# Patient Record
Sex: Female | Born: 1980 | Race: Black or African American | Hispanic: No | Marital: Single | Smoking: Never smoker
Health system: Southern US, Community
[De-identification: ages and names within clinical notes are randomized; demographics above are authoritative.]

## PROBLEM LIST (undated history)

## (undated) ENCOUNTER — Inpatient Hospital Stay: Payer: Self-pay

## (undated) DIAGNOSIS — T300 Burn of unspecified body region, unspecified degree: Secondary | ICD-10-CM

## (undated) DIAGNOSIS — E669 Obesity, unspecified: Secondary | ICD-10-CM

## (undated) DIAGNOSIS — Z803 Family history of malignant neoplasm of breast: Secondary | ICD-10-CM

## (undated) DIAGNOSIS — B977 Papillomavirus as the cause of diseases classified elsewhere: Secondary | ICD-10-CM

## (undated) HISTORY — PX: SKIN GRAFT FULL THICKNESS ARM: SUR1297

## (undated) HISTORY — DX: Papillomavirus as the cause of diseases classified elsewhere: B97.7

## (undated) HISTORY — DX: Family history of malignant neoplasm of breast: Z80.3

---

## 2011-04-19 HISTORY — PX: DILATION AND CURETTAGE OF UTERUS: SHX78

## 2011-05-07 ENCOUNTER — Emergency Department: Payer: Self-pay | Admitting: Emergency Medicine

## 2011-05-07 LAB — URINALYSIS, COMPLETE
Bacteria: NONE SEEN
Bilirubin,UR: NEGATIVE
Glucose,UR: NEGATIVE mg/dL (ref 0–75)
Ketone: NEGATIVE
Nitrite: NEGATIVE
Ph: 6 (ref 4.5–8.0)
RBC,UR: 1 /HPF (ref 0–5)
Squamous Epithelial: 6
WBC UR: 6 /HPF (ref 0–5)

## 2011-05-07 LAB — CBC
HCT: 37.7 % (ref 35.0–47.0)
HGB: 12.8 g/dL (ref 12.0–16.0)
RBC: 3.99 10*6/uL (ref 3.80–5.20)
RDW: 12.8 % (ref 11.5–14.5)
WBC: 6.7 10*3/uL (ref 3.6–11.0)

## 2011-05-07 LAB — COMPREHENSIVE METABOLIC PANEL
Anion Gap: 12 (ref 7–16)
BUN: 7 mg/dL (ref 7–18)
Calcium, Total: 8.7 mg/dL (ref 8.5–10.1)
Chloride: 104 mmol/L (ref 98–107)
Creatinine: 0.54 mg/dL — ABNORMAL LOW (ref 0.60–1.30)
EGFR (African American): >60
EGFR (Non-African Amer.): >60
Glucose: 98 mg/dL (ref 65–99)
SGOT(AST): 17 U/L (ref 15–37)
SGPT (ALT): 19 U/L
Total Protein: 7.2 g/dL (ref 6.4–8.2)

## 2011-05-09 ENCOUNTER — Ambulatory Visit: Payer: Self-pay

## 2011-05-09 LAB — HEMATOCRIT: HCT: 38.6 % (ref 35.0–47.0)

## 2011-05-10 ENCOUNTER — Ambulatory Visit: Payer: Self-pay

## 2011-07-19 ENCOUNTER — Emergency Department: Payer: Self-pay | Admitting: *Deleted

## 2011-08-11 ENCOUNTER — Ambulatory Visit: Payer: Self-pay | Admitting: Specialist

## 2011-08-11 LAB — PREGNANCY, URINE: Pregnancy Test, Urine: NEGATIVE m[IU]/mL

## 2012-09-21 DIAGNOSIS — B977 Papillomavirus as the cause of diseases classified elsewhere: Secondary | ICD-10-CM

## 2012-09-21 HISTORY — DX: Papillomavirus as the cause of diseases classified elsewhere: B97.7

## 2014-08-10 NOTE — Op Note (Signed)
PATIENT NAME:  [name], [name] MR#:  [id number] DATE OF BIRTH:  1980/05/26  DATE OF PROCEDURE:  05/10/2011  PREOPERATIVE DIAGNOSIS: Missed abortion.   POSTOPERATIVE DIAGNOSIS: Missed abortion.   OPERATION PERFORMED: Suction curettage.   SURGEON: Wonda Cheng. Laurey Morale, M.D.   OPERATIVE FINDINGS: Expected amount of tissue.   DESCRIPTION OF PROCEDURE: After adequate general anesthesia, the patient was prepped and draped in routine fashion. The cervix was grasped with a Jacob's tenaculum. It was already dilated. The uterine cavity was systematically suction curetted with a #10 suction curette with return of expected amount of tissue. The patient tolerated the procedure well and left the operating room in good condition. Sponge and needle counts were said to be correct at the end of the procedure.   ____________________________ Wonda Cheng. Laurey Morale, MD pjr:bjt D: 05/10/2011 14:22:52 ET T: 05/10/2011 14:34:46 ET JOB#: [id number]  cc: Wonda Cheng. Laurey Morale, MD, <Dictator> Rosina Lowenstein MD ELECTRONICALLY SIGNED 05/11/2011 18:09

## 2014-08-10 NOTE — Op Note (Signed)
PATIENT NAME:  [name], [name] MR#:  [id number] DATE OF BIRTH:  1980/12/30  DATE OF PROCEDURE:  08/11/2011  PREOPERATIVE DIAGNOSIS: Volar ganglion, left wrist.   POSTOPERATIVE DIAGNOSIS: Volar ganglion, left wrist.  PROCEDURE: Excision of volar radial ganglion, left wrist.   SURGEON: Lucas Mallow, MD   ANESTHESIA: General.   COMPLICATIONS: None.   TOURNIQUET TIME: Approximately 30 minutes.   DESCRIPTION OF PROCEDURE: After adequate induction of general anesthesia, the left upper extremity is thoroughly prepped with alcohol and DuraPrep and draped in standard sterile fashion. Extremity is wrapped out with the Esmarch bandage and pneumatic tourniquet elevated to 250 mmHg. Under loupe magnification, standard longitudinal incision is made over the prominence of the volar radial ganglion. The dissection is carried down to the ganglion sac. This is completely dissected out and is seen to be coming from between the brachial fascia. The brachial fascia is incised and the dissection carried down to the joint volarly itself. The vessels are preserved. The remainder of the root of the ganglion is seen to extend to the volar radial wrist capsule and this is completely excised along with a 1 cm in diameter area of wrist capsule. Careful search for any residual areas of ganglion are made and none are seen. The wound is thoroughly irrigated multiple times. Skin edges are infiltrated with 0.5% plain Marcaine. Skin is closed with one subcuticular 5-0 Vicryl and a running subcuticular 3-0 Prolene suture. Soft bulky dressing with a volar fiberglass splint is applied. Tourniquet is released. The patient is returned to the recovery room in satisfactory condition having tolerated the procedure quite well.   ____________________________ Lucas Mallow, MD ces:drc D: 08/11/2011 09:30:37 ET T: 08/11/2011 12:15:40 ET JOB#: [id number] cc: Lucas Mallow, MD, <Dictator> Lucas Mallow  MD ELECTRONICALLY SIGNED 08/25/2011 11:45

## 2016-04-18 NOTE — L&D Delivery Note (Signed)
Obstetrical Delivery Note   Date of Delivery:   02/26/2017 Primary OB:   [facility] Gestational Age/EDD: [redacted]w[redacted]d (Dated by 8wk ultrasound) Antepartum complications: AMA and history of preterm labor and delivery x 2  Delivered By:   Dalia Heading, CNM  Delivery Type:   spontaneous vaginal delivery  Procedure Details:   Mother pushed to deliver a 7#7oz female infant in ROA with body cord x1 and true knot x 1. Baby placed on mother's abdomen and dried/ given tactile stimulation which resulted in a good cry. After delayed cord clamping the cord was clamped x 2 and cut by the FOB. Baby then weighed and returned to mother for skin to skin time. Placenta and 3 vessel cord delivered spontaneously and intact. Uterine atony followed. The bladder was emptied of about 200 ml urine, clots were evacuated from the LUS and fundal massage resolved the atony.  First degree perineal and superfical left labia laceration repaired with 3-0 Chromic. Periurethral abrasions not repaired. Anesthesia:    Local for repair Intrapartum complications: Meconium stained amniotic fluid, Variable decelerations in second stage, body cord, true knot in cord. GBS:    negative Laceration:    First degree perineal laceration and left superficial labial lacerations Episiotomy:    none Placenta:    Via active 3rd stage. To pathology: no Estimated Blood Loss:  400 ml Baby:    Liveborn female, Apgars 8/8, weight 7#7oz [place], [place]

## 2016-07-11 ENCOUNTER — Emergency Department
Admission: EM | Admit: 2016-07-11 | Discharge: 2016-07-11 | Disposition: A | Discharge status: Home / Self Care | Payer: 59 | Attending: Emergency Medicine | Admitting: Emergency Medicine

## 2016-07-11 ENCOUNTER — Encounter: Payer: Self-pay | Admitting: Emergency Medicine

## 2016-07-11 ENCOUNTER — Emergency Department: Payer: 59

## 2016-07-11 DIAGNOSIS — Z3A01 Less than 8 weeks gestation of pregnancy: Secondary | ICD-10-CM | POA: Diagnosis not present

## 2016-07-11 DIAGNOSIS — O2341 Unspecified infection of urinary tract in pregnancy, first trimester: Secondary | ICD-10-CM | POA: Insufficient documentation

## 2016-07-11 DIAGNOSIS — R102 Pelvic and perineal pain: Secondary | ICD-10-CM | POA: Diagnosis not present

## 2016-07-11 DIAGNOSIS — O26891 Other specified pregnancy related conditions, first trimester: Secondary | ICD-10-CM

## 2016-07-11 DIAGNOSIS — R109 Unspecified abdominal pain: Secondary | ICD-10-CM

## 2016-07-11 LAB — URINALYSIS, COMPLETE (UACMP) WITH MICROSCOPIC
BACTERIA UA: NONE SEEN
BILIRUBIN URINE: NEGATIVE
Glucose, UA: NEGATIVE mg/dL
Hgb urine dipstick: NEGATIVE
Ketones, ur: 5 mg/dL — AB
NITRITE: NEGATIVE
Protein, ur: NEGATIVE mg/dL
RBC / HPF: NONE SEEN RBC/hpf (ref 0–5)
Specific Gravity, Urine: 1.03 (ref 1.005–1.030)
pH: 6 (ref 5.0–8.0)

## 2016-07-11 LAB — COMPREHENSIVE METABOLIC PANEL
ALT: 13 U/L — AB (ref 14–54)
AST: 16 U/L (ref 15–41)
Albumin: 4.1 g/dL (ref 3.5–5.0)
Alkaline Phosphatase: 49 U/L (ref 38–126)
Anion gap: 5 (ref 5–15)
BUN: 11 mg/dL (ref 6–20)
CO2: 26 mmol/L (ref 22–32)
CREATININE: 0.7 mg/dL (ref 0.44–1.00)
Calcium: 9.1 mg/dL (ref 8.9–10.3)
Chloride: 104 mmol/L (ref 101–111)
Glucose, Bld: 101 mg/dL — ABNORMAL HIGH (ref 65–99)
Potassium: 3.3 mmol/L — ABNORMAL LOW (ref 3.5–5.1)
SODIUM: 135 mmol/L (ref 135–145)
Total Bilirubin: 0.6 mg/dL (ref 0.3–1.2)
Total Protein: 7.3 g/dL (ref 6.5–8.1)

## 2016-07-11 LAB — CBC
HCT: 37.8 % (ref 35.0–47.0)
Hemoglobin: 13 g/dL (ref 12.0–16.0)
MCH: 32.6 pg (ref 26.0–34.0)
MCHC: 34.3 g/dL (ref 32.0–36.0)
MCV: 94.9 fL (ref 80.0–100.0)
Platelets: 290 10*3/uL (ref 150–440)
RBC: 3.98 MIL/uL (ref 3.80–5.20)
RDW: 13.5 % (ref 11.5–14.5)
WBC: 8.6 10*3/uL (ref 3.6–11.0)

## 2016-07-11 LAB — LIPASE, BLOOD: Lipase: 16 U/L (ref 11–51)

## 2016-07-11 LAB — HCG, QUANTITATIVE, PREGNANCY: hCG, Beta Chain, Quant, S: 47448 m[IU]/mL — ABNORMAL HIGH (ref <5)

## 2016-07-11 MED ORDER — PRENATAL VITAMINS 0.8 MG PO TABS: 1.0000 | ORAL_TABLET | Freq: Every day | ORAL | 0 refills | Status: DC

## 2016-07-11 MED ORDER — ACETAMINOPHEN 325 MG PO TABS
650.0000 mg | ORAL_TABLET | Freq: Once | ORAL | Status: AC
  Administered 2016-07-11: 650 mg via ORAL
  Filled 2016-07-11: qty 2

## 2016-07-11 MED ORDER — CEPHALEXIN 500 MG PO CAPS
500.0000 mg | ORAL_CAPSULE | Freq: Once | ORAL | Status: AC
  Administered 2016-07-11: 500 mg via ORAL
  Filled 2016-07-11: qty 1

## 2016-07-11 MED ORDER — CEPHALEXIN 500 MG PO CAPS: 500.0000 mg | ORAL_CAPSULE | Freq: Two times a day (BID) | ORAL | 0 refills | Status: AC

## 2016-07-11 NOTE — ED Triage Notes (Signed)
Pt has low abd pain.  Pt had positive home pregnancy test.  No dysuria.  No vag bleeding.  Pt alert.

## 2016-07-11 NOTE — ED Provider Notes (Signed)
[facility] Emergency Department Provider Note  ____________________________________________   First MD Initiated Contact with Patient 07/11/16 2034     (approximate)  I have reviewed the triage vital signs and the nursing notes.   HISTORY  Chief Complaint Abdominal Pain   HPI [name] is a 36 y.o. female who is a G5 P2 with one miscarriage and one abortion who is presenting to the emergency department today with lower abdominal pain. She says that her last period was in February and she had a positive pregnancy test at home. She has no history of ectopic pregnancies or STDs. Is denying any vaginal bleeding or discharge. Says that her pain is a 6 out of 10. No associated nausea and vomiting.   No past medical history on file.  There are no active problems to display for this patient.   No past surgical history on file.  Prior to Admission medications   Not on File    Allergies Patient has no known allergies.  No family history on file.  Social History Social History  Substance Use Topics  . Smoking status: Not on file  . Smokeless tobacco: Not on file  . Alcohol use Not on file    Review of Systems Constitutional: No fever/chills Eyes: No visual changes. ENT: No sore throat. Cardiovascular: Denies chest pain. Respiratory: Denies shortness of breath. Gastrointestinal:  No nausea, no vomiting.  No diarrhea.  No constipation. Genitourinary: Negative for dysuria. Musculoskeletal: Negative for back pain. Skin: Negative for rash. Neurological: Negative for headaches, focal weakness or numbness.  10-point ROS otherwise negative.  ____________________________________________   PHYSICAL EXAM:  VITAL SIGNS: ED Triage Vitals  Enc Vitals Group     BP 07/11/16 1716 134/72     Pulse Rate 07/11/16 1716 71     Resp 07/11/16 1716 16     Temp 07/11/16 1716 98.3 F (36.8 C)     Temp Source 07/11/16 1716 Oral     SpO2 07/11/16 1716  100 %     Weight 07/11/16 1716 205 lb (93 kg)     Height 07/11/16 1716 5\' 7"  (1.702 m)     Head Circumference --      Peak Flow --      Pain Score 07/11/16 1722 6     Pain Loc --      Pain Edu? --      Excl. in [place]? --     Constitutional: Alert and oriented. Well appearing and in no acute distress. Eyes: Conjunctivae are normal. PERRL. EOMI. Head: Atraumatic. Nose: No congestion/rhinnorhea. Mouth/Throat: Mucous membranes are moist.   Neck: No stridor.   Cardiovascular: Normal rate, regular rhythm. Grossly normal heart sounds.  Good peripheral circulation. Respiratory: Normal respiratory effort.  No retractions. Lungs CTAB. Gastrointestinal: Soft and With mild suprapubic tenderness. No distention.  No CVA tenderness. Musculoskeletal: No lower extremity tenderness nor edema.  No joint effusions. Neurologic:  Normal speech and language. No gross focal neurologic deficits are appreciated.  Skin:  Skin is warm, dry and intact. No rash noted. Psychiatric: Mood and affect are normal. Speech and behavior are normal.  ____________________________________________   LABS (all labs ordered are listed, but only abnormal results are displayed)  Labs Reviewed  COMPREHENSIVE METABOLIC PANEL - Abnormal; Notable for the following:       Result Value   Potassium 3.3 (*)    Glucose, Bld 101 (*)    ALT 13 (*)    All other components within normal  limits  URINALYSIS, COMPLETE (UACMP) WITH MICROSCOPIC - Abnormal; Notable for the following:    Color, Urine YELLOW (*)    APPearance HAZY (*)    Ketones, ur 5 (*)    Leukocytes, UA SMALL (*)    Squamous Epithelial / LPF 6-30 (*)    All other components within normal limits  HCG, QUANTITATIVE, PREGNANCY - Abnormal; Notable for the following:    hCG, Beta Chain, Quant, S 47,448 (*)    All other components within normal limits  URINE CULTURE  LIPASE, BLOOD  CBC  POC URINE PREG, ED    ____________________________________________  EKG   ____________________________________________  RADIOLOGY    US OB Transvaginal (Final result)  Result time 07/11/16 23:07:03  Final result by Massie Kluver, MD (07/11/16 23:07:03)           Narrative:   CLINICAL DATA: Abdominal pain. No vaginal bleeding. Beta HCG 47,448  EXAM: OBSTETRIC <14 WK [place] AND TRANSVAGINAL OB US  TECHNIQUE: Both transabdominal and transvaginal ultrasound examinations were performed for complete evaluation of the gestation as well as the maternal uterus, adnexal regions, and pelvic cul-de-sac. Transvaginal technique was performed to assess early pregnancy.  COMPARISON: None.  FINDINGS: Intrauterine gestational sac: Single  Yolk sac: Visualized.  Embryo: Visualized.  Cardiac Activity: Visualized.  Heart Rate: 95 bpm  CRL: 3.2 mm  6 w  0 d         [place] EDC: 09/03/2016  Subchorionic hemorrhage: None visualized.  Maternal uterus/adnexae: Normal  IMPRESSION: 1. Viable 6 week 0 day single live intrauterine gestation. 2. Low gestational heart rate at 95 beats per minute for this stage of gestation. This has been associated with poor future viability of the pregnancy. Critical Value/emergent results were called by telephone at the time of interpretation on 07/11/2016 at 11:06 pm to Dr. Larae Grooms , who verbally acknowledged these results. 3. No subchorionic hemorrhage.   Electronically Signed By: Ashley Royalty M.D. On: 07/11/2016 23:07            US OB Comp Less 14 Wks (Final result)  Result time 07/11/16 23:07:03  Final result by Massie Kluver, MD (07/11/16 23:07:03)           Narrative:   CLINICAL DATA: Abdominal pain. No vaginal bleeding. Beta HCG 47,448  EXAM: OBSTETRIC <14 WK [place] AND TRANSVAGINAL OB US  TECHNIQUE: Both transabdominal and transvaginal ultrasound examinations were performed for complete evaluation of the gestation as  well as the maternal uterus, adnexal regions, and pelvic cul-de-sac. Transvaginal technique was performed to assess early pregnancy.  COMPARISON: None.  FINDINGS: Intrauterine gestational sac: Single  Yolk sac: Visualized.  Embryo: Visualized.  Cardiac Activity: Visualized.  Heart Rate: 95 bpm  CRL: 3.2 mm  6 w  0 d         [place] EDC: 09/03/2016  Subchorionic hemorrhage: None visualized.  Maternal uterus/adnexae: Normal  IMPRESSION: 1. Viable 6 week 0 day single live intrauterine gestation. 2. Low gestational heart rate at 95 beats per minute for this stage of gestation. This has been associated with poor future viability of the pregnancy. Critical Value/emergent results were called by telephone at the time of interpretation on 07/11/2016 at 11:06 pm to Dr. Larae Grooms , who verbally acknowledged these results. 3. No subchorionic hemorrhage.   Electronically Signed By: Ashley Royalty M.D. On: 07/11/2016 23:07            ____________________________________________   PROCEDURES  Procedure(s) performed:   Procedures  Critical Care performed:  ____________________________________________   INITIAL IMPRESSION / ASSESSMENT AND PLAN / ED COURSE  Pertinent labs & imaging results that were available during my care of the patient were reviewed by me and considered in my medical decision making (see chart for details).  ----------------------------------------- 11:16 PM on 07/11/2016 -----------------------------------------  Patient resting comfortably at this time. We discussed the RESULTS AND THE NEED FOR PELVIC REST AS WELL AS START HER PRENATAL VITAMINS. WE ALSO DISCUSSED what could be a mild UTI vs contamination. We'll be starting antibiotics for this as a precaution.  Pt will be starting pre natal vitamins and has an appointment at [facility] ob[facility]gyn on the 11th of April.  Pt denies smoking, drinking or drug use.  She is understanding  of the plan and willing to comply.        ____________________________________________   FINAL CLINICAL IMPRESSION(S) / ED DIAGNOSES  Abdominal pain in pregnancy UTI Low fetal heart rate    NEW MEDICATIONS STARTED DURING THIS VISIT:  New Prescriptions   No medications on file     Note:  This document was prepared using Dragon voice recognition software and may include unintentional dictation errors.    Orbie Pyo, MD 07/11/16 [phone]

## 2016-07-11 NOTE — ED Notes (Signed)
POC preg done and result positive.

## 2016-07-13 LAB — URINE CULTURE

## 2016-07-27 ENCOUNTER — Encounter: Payer: Self-pay | Admitting: Advanced Practice Midwife

## 2016-07-27 ENCOUNTER — Other Ambulatory Visit: Payer: 59

## 2016-07-27 ENCOUNTER — Ambulatory Visit (INDEPENDENT_AMBULATORY_CARE_PROVIDER_SITE_OTHER): Payer: 59 | Admitting: Advanced Practice Midwife

## 2016-07-27 VITALS — BP 110/70 | HR 68 | Wt 211.0 lb

## 2016-07-27 DIAGNOSIS — Z131 Encounter for screening for diabetes mellitus: Secondary | ICD-10-CM

## 2016-07-27 DIAGNOSIS — O0990 Supervision of high risk pregnancy, unspecified, unspecified trimester: Secondary | ICD-10-CM

## 2016-07-27 DIAGNOSIS — O09529 Supervision of elderly multigravida, unspecified trimester: Secondary | ICD-10-CM

## 2016-07-27 DIAGNOSIS — O3680X0 Pregnancy with inconclusive fetal viability, not applicable or unspecified: Secondary | ICD-10-CM | POA: Diagnosis not present

## 2016-07-27 DIAGNOSIS — E669 Obesity, unspecified: Secondary | ICD-10-CM

## 2016-07-27 DIAGNOSIS — Z6830 Body mass index (BMI) 30.0-30.9, adult: Secondary | ICD-10-CM

## 2016-07-27 DIAGNOSIS — O09219 Supervision of pregnancy with history of pre-term labor, unspecified trimester: Secondary | ICD-10-CM

## 2016-07-27 DIAGNOSIS — Z1379 Encounter for other screening for genetic and chromosomal anomalies: Secondary | ICD-10-CM

## 2016-07-27 DIAGNOSIS — Z369 Encounter for antenatal screening, unspecified: Secondary | ICD-10-CM

## 2016-07-27 DIAGNOSIS — Z113 Encounter for screening for infections with a predominantly sexual mode of transmission: Secondary | ICD-10-CM

## 2016-07-27 DIAGNOSIS — O09899 Supervision of other high risk pregnancies, unspecified trimester: Secondary | ICD-10-CM

## 2016-07-27 LAB — POCT URINE PREGNANCY: PREG TEST UR: POSITIVE — AB

## 2016-07-27 NOTE — Progress Notes (Signed)
New Obstetric Patient H&P    Chief Complaint: "Desires prenatal care"   History of Present Illness: Patient is a 36 y.o. [id number] Not Hispanic or Latino female, LMP 06/03/2016 presents with amenorrhea and positive home pregnancy test. Based on her  LMP, her EDD is Estimated Date of Delivery: 03/10/2017. and her EGA is [redacted]w[redacted]d. Cycles are 5. days, regular, and occur approximately every : 28 days. Her last pap smear was a few months ago and was no abnormalities.    She had a urine pregnancy test which was positive 3 week(s)  ago. Her last menstrual period was normal and lasted for  5 day(s). Since her LMP she claims she has experienced breast tenderness, fatigue, nausea. She denies vaginal bleeding. Her past medical history is noncontributory. Her prior pregnancies are notable for preterm deliveries at 32 weeks and 36 weeks. She took 17P and carried the second baby to 36 weeks. She has a history of a TAB and SAB.  Since her LMP, she admits to the use of tobacco products  no She claims she has gained   16 pounds since the start of her pregnancy.  There are cats in the home in the home  no  She admits close contact with children on a regular basis  yes  She has had chicken pox in the past no She has had Tuberculosis exposures, symptoms, or previously tested positive for TB   no Current or past history of domestic violence. no  Genetic Screening/Teratology Counseling: (Includes patient, baby's father, or anyone in either family with:)   11. Patient's age >/= 59 at [facility]  yes 2. Thalassemia ([place], [place], [place], or Asian background): MCV<80  no 3. Neural tube defect (meningomyelocele, spina bifida, anencephaly)  no 4. Congenital heart defect  no  5. Down syndrome  no 6. Tay-Sachs (Jewish, [place])  no 7. Canavan's Disease  no 8. Sickle cell disease or trait (African)  no  9. Hemophilia or other blood disorders  no  10. Muscular dystrophy  no  11. Cystic fibrosis  no    12. Huntington's Chorea  no  13. Mental retardation/autism  no 14. Other inherited genetic or chromosomal disorder  no 15. Maternal metabolic disorder (DM, PKU, etc)  no 16. Patient or FOB with a child with a birth defect not listed above no  16a. Patient or FOB with a birth defect themselves no 17. Recurrent pregnancy loss, or stillbirth  no  18. Any medications since LMP other than prenatal vitamins (include vitamins, supplements, OTC meds, drugs, alcohol)  no 19. Any other genetic/environmental exposure to discuss  No  Infection History:   1. Lives with someone with TB or TB exposed  no  2. Patient or partner has history of genital herpes  no 3. Rash or viral illness since LMP  no 4. History of STI (GC, CT, HPV, syphilis, HIV)  no 5. History of recent travel :  no  Other pertinent information:  yes Pt had an episode of heavy drinking prior to her knowledge of the pregnancy   Review of Systems:10 point review of systems negative unless otherwise noted in HPI  Past Medical History:  History reviewed. No pertinent past medical history.  Past Surgical History:  History reviewed. No pertinent surgical history.  Gynecologic History: Patient's last menstrual period was 06/03/2016 (approximate).  Obstetric History: [id number]  Family History:  Family History  Problem Relation Age of Onset  . Hypertension Mother   . Hypertension  Father   . Diabetes Paternal Grandmother   . Breast cancer Maternal Grandmother 49    Social History:  Social History   Social History  . Marital status: Single    Spouse name: N/A  . Number of children: N/A  . Years of education: N/A   Occupational History  . Not on file.   Social History Main Topics  . Smoking status: Never Smoker  . Smokeless tobacco: Never Used  . Alcohol use No  . Drug use: No  . Sexual activity: Yes    Birth control/ protection: None   Other Topics Concern  . Not on file   Social History Narrative  . No  narrative on file    Allergies:  No Known Allergies  Medications: Prior to Admission medications   Medication Sig Start Date End Date Taking? Authorizing Provider  Prenatal Vit-Fe Fumarate-FA (PNV PRENATAL PLUS MULTIVITAMIN) 27-1 MG TABS Take 1 tablet by mouth daily. 07/12/16   Historical Provider, MD    Physical Exam Vitals: Blood pressure 110/70, pulse 68, weight 211 lb (95.7 kg), last menstrual period 06/03/2016.  General: NAD HEENT: normocephalic, anicteric Thyroid: no enlargement, no palpable nodules Pulmonary: No increased work of breathing, CTAB Cardiovascular: RRR, distal pulses 2+ Abdomen: NABS, soft, non-tender, non-distended.  Umbilicus without lesions.  No hepatomegaly, splenomegaly or masses palpable. No evidence of hernia  Genitourinary:  External: Normal external female genitalia.  Normal urethral meatus, normal  Bartholin's and Skene's glands.    Vagina: Normal vaginal mucosa, no evidence of prolapse.    Cervix: Grossly normal in appearance, no bleeding, No CMT  Uterus: Enlarged, mobile, normal contour.    Adnexa: ovaries non-enlarged, no adnexal masses  Rectal: deferred Extremities: no edema, erythema, or tenderness Neurologic: Grossly intact Psychiatric: mood appropriate, affect full   Assessment: 36 y.o. [id number] at [redacted]w[redacted]d by U/S today with an EDD of 03/04/2017  Plan: 1) Avoid alcoholic beverages. 2) Patient encouraged not to smoke.  3) Discontinue the use of all non-medicinal drugs and chemicals.  4) Take prenatal vitamins daily.  5) Nutrition, food safety (fish, cheese advisories, and high nitrite foods) and exercise discussed. 6) Hospital and practice style discussed with cross coverage system.  7) Genetic Screening, such as with 1st Trimester Screening, cell free fetal DNA, AFP testing, and Ultrasound, as well as with amniocentesis and CVS as appropriate, is discussed with patient. At the conclusion of today's visit patient requested genetic testing 8)  Patient is asked about travel to areas at risk for the Zika virus, and counseled to avoid travel and exposure to mosquitoes or sexual partners who may have themselves been exposed to the virus. Testing is discussed, and will be ordered as appropriate.  9) Prenatal labs, early 1 hr gtt at next visit 10) Pt requests genetic screening for significant family history of breast cancer   Rod Can, CNM

## 2016-07-29 LAB — URINE CULTURE

## 2016-07-29 LAB — GC/CHLAMYDIA PROBE AMP
Chlamydia trachomatis, NAA: NEGATIVE
NEISSERIA GONORRHOEAE BY PCR: NEGATIVE

## 2016-08-24 ENCOUNTER — Ambulatory Visit (INDEPENDENT_AMBULATORY_CARE_PROVIDER_SITE_OTHER): Payer: 59 | Admitting: Obstetrics and Gynecology

## 2016-08-24 ENCOUNTER — Ambulatory Visit (INDEPENDENT_AMBULATORY_CARE_PROVIDER_SITE_OTHER): Payer: 59

## 2016-08-24 VITALS — BP 116/78 | Wt 216.0 lb

## 2016-08-24 DIAGNOSIS — Z36 Encounter for antenatal screening for chromosomal anomalies: Secondary | ICD-10-CM

## 2016-08-24 DIAGNOSIS — O0990 Supervision of high risk pregnancy, unspecified, unspecified trimester: Secondary | ICD-10-CM

## 2016-08-24 DIAGNOSIS — R87618 Other abnormal cytological findings on specimens from cervix uteri: Secondary | ICD-10-CM

## 2016-08-24 DIAGNOSIS — R8789 Other abnormal findings in specimens from female genital organs: Secondary | ICD-10-CM

## 2016-08-24 DIAGNOSIS — Z369 Encounter for antenatal screening, unspecified: Secondary | ICD-10-CM

## 2016-08-24 DIAGNOSIS — O09521 Supervision of elderly multigravida, first trimester: Secondary | ICD-10-CM

## 2016-08-24 DIAGNOSIS — Z3A12 12 weeks gestation of pregnancy: Secondary | ICD-10-CM

## 2016-08-24 DIAGNOSIS — O09899 Supervision of other high risk pregnancies, unspecified trimester: Secondary | ICD-10-CM

## 2016-08-24 DIAGNOSIS — O09219 Supervision of pregnancy with history of pre-term labor, unspecified trimester: Secondary | ICD-10-CM

## 2016-08-24 MED ORDER — HYDROXYPROGESTERONE CAPROATE 250 MG/ML IM OIL: 250.0000 mg | TOPICAL_OIL | INTRAMUSCULAR | 4 refills | Status: DC

## 2016-08-24 NOTE — Progress Notes (Signed)
Dating scan today.  

## 2016-08-24 NOTE — Progress Notes (Signed)
    Routine Prenatal Care Visit  Subjective  Fetal Movement? no Contractions? no Leaking Fluid? no Vaginal Bleeding? Yes light spotting still  Objective   Vitals:   08/24/16 1101  BP: 116/78    @WEIGHTCHANGE @ Urine dipstick shows negative for all components.  General: NAD Pumonary: no increased work of breathing Abdomen: gravid, non-tender, fundal height fetal heart tones 160BPM GU: normal external female genitalia, normal cervix, no bleeding Extremities: no edema Psychiatric: mood appropriate, affect full   Assessment   36 y.o. [id number] at [redacted]w[redacted]d by  03/04/2017, by Ultrasound presenting for routine prenatal visit  pregnancy 5 Problems (from 06/03/16 to present)    No problems associated with this episode.       Plan   Problem List Items Addressed This Visit      Other   Pregnancy, supervision, high-risk, unspecified trimester   Relevant Orders   informaSeq(SM) with XY Analysis   History of preterm delivery, currently pregnant   Relevant Orders   US OB Transvaginal    Other Visit Diagnoses    [redacted] weeks gestation of pregnancy    -  Primary   Relevant Orders   RPR+Rh+ABO+Rub Ab+Ab Scr+CB...   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

## 2016-08-24 NOTE — Patient Instructions (Signed)

## 2016-08-26 LAB — RPR+RH+ABO+RUB AB+AB SCR+CB...
ANTIBODY SCREEN: NEGATIVE
HEMATOCRIT: 36.5 % (ref 34.0–46.6)
HEMOGLOBIN: 12.5 g/dL (ref 11.1–15.9)
HIV Screen 4th Generation wRfx: NONREACTIVE
Hepatitis B Surface Ag: NEGATIVE
MCH: 32.1 pg (ref 26.6–33.0)
MCHC: 34.2 g/dL (ref 31.5–35.7)
MCV: 94 fL (ref 79–97)
Platelets: 315 10*3/uL (ref 150–379)
RBC: 3.89 x10E6/uL (ref 3.77–5.28)
RDW: 13.6 % (ref 12.3–15.4)
RPR Ser Ql: NONREACTIVE
Rh Factor: POSITIVE
Rubella Antibodies, IGG: 1.31 index (ref >0.99)
Varicella zoster IgG: 1000 index (ref >165)
WBC: 7.6 10*3/uL (ref 3.4–10.8)

## 2016-08-26 LAB — URINE CULTURE

## 2016-08-26 LAB — HEMOGLOBINOPATHY EVALUATION
HEMOGLOBIN A2 QUANTITATION: 2.7 % (ref 1.8–3.2)
HEMOGLOBIN F QUANTITATION: 0 % (ref 0.0–2.0)
HGB C: 0 %
HGB S: 0 %
HGB VARIANT: 0 %
Hgb A: 97.3 % (ref 96.4–98.8)

## 2016-08-27 LAB — PAPIG, CTNGTV, HPV, RFX 16/18
CHLAMYDIA, NUC. ACID AMP: NEGATIVE
Gonococcus, Nuc. Acid Amp: NEGATIVE
PAP SMEAR COMMENT: 0
TRICH VAG BY NAA: NEGATIVE

## 2016-08-27 LAB — HPV, LOW VOLUME (REFLEX): HPV low volume reflex: NEGATIVE

## 2016-08-30 ENCOUNTER — Telehealth: Payer: Self-pay

## 2016-08-30 NOTE — Telephone Encounter (Signed)
FMLA/DISABILITY form filled out for [facility] [facility] [employer] and given to [place] for processing.

## 2016-09-01 ENCOUNTER — Encounter: Payer: Self-pay | Admitting: Obstetrics and Gynecology

## 2016-09-01 NOTE — Progress Notes (Signed)
Can we see why the informaseq didn't result

## 2016-09-02 ENCOUNTER — Encounter: Payer: Self-pay | Admitting: Obstetrics and Gynecology

## 2016-09-02 LAB — INFORMASEQ(SM) WITH XY ANALYSIS
FETAL FRACTION (%): 7.9
Fetal Number: 1
Gestational Age at Collection: 12.6 weeks
Weight: 216 [lb_av]

## 2016-09-02 NOTE — Progress Notes (Signed)
I called and spoke to [place] [facility] and gave her the missing information. She states report should be resulted in next few days. KJ CMA

## 2016-09-02 NOTE — Progress Notes (Signed)
Krystal, [facility], checking on this.

## 2016-09-02 NOTE — Progress Notes (Signed)
Per Albina Billet, nurse has to call number provided on results and give additional information. I will do this.

## 2016-09-09 ENCOUNTER — Other Ambulatory Visit: Payer: Self-pay | Admitting: Advanced Practice Midwife

## 2016-09-09 DIAGNOSIS — O09219 Supervision of pregnancy with history of pre-term labor, unspecified trimester: Principal | ICD-10-CM

## 2016-09-09 DIAGNOSIS — O09899 Supervision of other high risk pregnancies, unspecified trimester: Secondary | ICD-10-CM

## 2016-09-09 MED ORDER — HYDROXYPROGESTERONE CAPROATE 275 MG/1.1ML ~~LOC~~ SOAJ: 1.0000 | SUBCUTANEOUS | 0 refills | Status: DC

## 2016-09-15 ENCOUNTER — Telehealth: Payer: Self-pay | Admitting: Obstetrics & Gynecology

## 2016-09-15 NOTE — Telephone Encounter (Signed)
Pt is calling about an missed call from 09/14/16. Pt says she takes lunch at 12. And gets off work 3:30. Please Call back .

## 2016-09-15 NOTE — Telephone Encounter (Signed)
Pt aware [name] is here.  She was given the option to come in on a diff day from her appt to get it sooner or get [facility] when she comes for her appt.  Pt opts to get inj at appt so she doesn't have to make two trips.

## 2016-09-21 ENCOUNTER — Encounter: Payer: 59 | Admitting: Obstetrics & Gynecology

## 2016-09-21 ENCOUNTER — Other Ambulatory Visit: Payer: 59

## 2016-09-21 ENCOUNTER — Ambulatory Visit (INDEPENDENT_AMBULATORY_CARE_PROVIDER_SITE_OTHER): Payer: 59

## 2016-09-21 ENCOUNTER — Ambulatory Visit (INDEPENDENT_AMBULATORY_CARE_PROVIDER_SITE_OTHER): Payer: 59 | Admitting: Obstetrics & Gynecology

## 2016-09-21 ENCOUNTER — Encounter: Payer: Self-pay | Admitting: Obstetrics & Gynecology

## 2016-09-21 VITALS — BP 110/60 | Wt 218.0 lb

## 2016-09-21 DIAGNOSIS — O09899 Supervision of other high risk pregnancies, unspecified trimester: Secondary | ICD-10-CM

## 2016-09-21 DIAGNOSIS — O09219 Supervision of pregnancy with history of pre-term labor, unspecified trimester: Secondary | ICD-10-CM

## 2016-09-21 DIAGNOSIS — O0990 Supervision of high risk pregnancy, unspecified, unspecified trimester: Secondary | ICD-10-CM

## 2016-09-21 DIAGNOSIS — Z3A16 16 weeks gestation of pregnancy: Secondary | ICD-10-CM

## 2016-09-21 DIAGNOSIS — O09529 Supervision of elderly multigravida, unspecified trimester: Secondary | ICD-10-CM

## 2016-09-21 MED ORDER — HYDROXYPROGESTERONE CAPROATE 275 MG/1.1ML ~~LOC~~ SOAJ
275.0000 mg | Freq: Once | SUBCUTANEOUS | Status: AC
  Administered 2016-09-21: 275 mg via SUBCUTANEOUS

## 2016-09-21 MED ORDER — FERRALET 90 90-1 MG PO TABS: 1.0000 | ORAL_TABLET | Freq: Every day | ORAL | 2 refills | Status: DC

## 2016-09-21 MED ORDER — DOXYLAMINE-PYRIDOXINE 10-10 MG PO TBEC: 2.0000 | DELAYED_RELEASE_TABLET | Freq: Every day | ORAL | 5 refills | Status: DC

## 2016-09-21 MED ORDER — FERROUS GLUCONATE 324 (38 FE) MG PO TABS: 324.0000 mg | ORAL_TABLET | Freq: Every day | ORAL | 3 refills | Status: DC

## 2016-09-21 MED ORDER — PROMETHAZINE HCL 25 MG PO TABS: 25.0000 mg | ORAL_TABLET | Freq: Four times a day (QID) | ORAL | 2 refills | Status: DC | PRN

## 2016-09-21 NOTE — Progress Notes (Signed)
[place] cervix >3 cm today.  Discussed. cfDNA normal XY Diclegis for nausea, Fe for weakness fatigue Desires/Plan for 17OH-P, first shot today.

## 2016-09-21 NOTE — Addendum Note (Signed)
Addended by: Quintella Baton D on: 09/21/2016 10:24 AM   Modules accepted: Orders

## 2016-09-21 NOTE — Addendum Note (Signed)
Addended by: Gae Dry on: 09/21/2016 10:15 AM   Modules accepted: Orders

## 2016-09-26 ENCOUNTER — Telehealth: Payer: Self-pay

## 2016-09-26 NOTE — Telephone Encounter (Signed)
Pt is having an issue c Makena inj.  [facility] in [place] is telling her she has to go to [facility] to p/u or have it mailed.  It may not be here by Wed if mailed.  The new subq Makena made her arm hurt.  She didn't like it and wanted the old kind which was ordered.  Adv to call [facility] in [place] and have them to mail it to [place].

## 2016-09-28 ENCOUNTER — Other Ambulatory Visit: Payer: Self-pay

## 2016-09-28 ENCOUNTER — Ambulatory Visit (INDEPENDENT_AMBULATORY_CARE_PROVIDER_SITE_OTHER): Payer: 59

## 2016-09-28 ENCOUNTER — Telehealth: Payer: Self-pay

## 2016-09-28 MED ORDER — HYDROXYPROGESTERONE CAPROATE 250 MG/ML IM OIL: TOPICAL_OIL | INTRAMUSCULAR | 4 refills | Status: DC

## 2016-09-28 MED ORDER — HYDROXYPROGESTERONE CAPROATE 250 MG/ML IM OIL
250.0000 mg | TOPICAL_OIL | Freq: Once | INTRAMUSCULAR | Status: AC
  Administered 2016-09-28: 250 mg via INTRAMUSCULAR

## 2016-09-28 NOTE — Progress Notes (Signed)
Pt here for Makena inj which was given IM right glut.  Pt requested to switch to IM inj instead of SubQ.  NDC# [phone]

## 2016-09-28 NOTE — Telephone Encounter (Signed)
Pt left msg on triage stating that she needed to cancel appt for injection today due to not sure what is going on with the medication. Per telephone note on 09/26/16 pt was to call [facility] in [place] to have them mail it to [place]. Left msg for pt to call back and get details from her before contacting the pharmacy.

## 2016-10-03 ENCOUNTER — Encounter: Payer: Self-pay | Admitting: Obstetrics and Gynecology

## 2016-10-03 NOTE — Telephone Encounter (Signed)
Pt was ordered injectable makena and shipment has arrived. Pt aware.

## 2016-10-05 ENCOUNTER — Other Ambulatory Visit: Payer: 59

## 2016-10-05 ENCOUNTER — Encounter: Payer: 59 | Admitting: Obstetrics and Gynecology

## 2016-10-13 ENCOUNTER — Encounter: Payer: Self-pay | Admitting: Obstetrics and Gynecology

## 2016-10-14 ENCOUNTER — Encounter: Payer: Self-pay | Admitting: Obstetrics and Gynecology

## 2016-10-18 ENCOUNTER — Ambulatory Visit (INDEPENDENT_AMBULATORY_CARE_PROVIDER_SITE_OTHER): Payer: 59 | Admitting: Obstetrics and Gynecology

## 2016-10-18 ENCOUNTER — Telehealth: Payer: Self-pay

## 2016-10-18 VITALS — BP 114/74 | Wt 226.0 lb

## 2016-10-18 DIAGNOSIS — O09899 Supervision of other high risk pregnancies, unspecified trimester: Secondary | ICD-10-CM

## 2016-10-18 DIAGNOSIS — O4692 Antepartum hemorrhage, unspecified, second trimester: Secondary | ICD-10-CM | POA: Diagnosis not present

## 2016-10-18 DIAGNOSIS — O09529 Supervision of elderly multigravida, unspecified trimester: Secondary | ICD-10-CM

## 2016-10-18 DIAGNOSIS — O09219 Supervision of pregnancy with history of pre-term labor, unspecified trimester: Secondary | ICD-10-CM

## 2016-10-18 DIAGNOSIS — Z3A20 20 weeks gestation of pregnancy: Secondary | ICD-10-CM

## 2016-10-18 DIAGNOSIS — O0990 Supervision of high risk pregnancy, unspecified, unspecified trimester: Secondary | ICD-10-CM

## 2016-10-18 LAB — POCT WET PREP WITH KOH
Clue Cells Wet Prep HPF POC: NEGATIVE
PH, VAGINAL: 5
Trichomonas, UA: NEGATIVE
Yeast Wet Prep HPF POC: NEGATIVE

## 2016-10-18 NOTE — Progress Notes (Signed)
Work in [employer] pt, bleeding. Noted bright red spotting on tissue last night. None since. Mild cramping last night. None present.  No abnormal discharge.   SSE: NEFG, no blood in vaginal vault. Cervix appears closed (confirmed by digital exam) No lesions seen  Wet prep: PH: 5.0 Trichomonas: absent Yeast: absent Clue cells: absent  17-OHPC today. F/u 1 week. Pt reassured. Precautions given.

## 2016-10-18 NOTE — Telephone Encounter (Signed)
Pt had a little bleeding last night, some colored d/c still there.  Is supposed to go to [facility] to the beach tomorrow and wanted to be seen.  She did have IC Sun.  Adv it could be from that but we need to ck her anyway.  Appt this am at 11:10 c SDJ.

## 2016-10-20 ENCOUNTER — Other Ambulatory Visit: Payer: Self-pay | Admitting: Obstetrics and Gynecology

## 2016-10-20 ENCOUNTER — Telehealth: Payer: Self-pay

## 2016-10-20 DIAGNOSIS — Z363 Encounter for antenatal screening for malformations: Secondary | ICD-10-CM

## 2016-10-20 NOTE — Telephone Encounter (Signed)
Specialty pharmacy calling because they received a Makena order and it is missing additional information. CB# [phone]

## 2016-10-24 ENCOUNTER — Ambulatory Visit (INDEPENDENT_AMBULATORY_CARE_PROVIDER_SITE_OTHER): Payer: 59 | Admitting: Obstetrics and Gynecology

## 2016-10-24 ENCOUNTER — Ambulatory Visit (INDEPENDENT_AMBULATORY_CARE_PROVIDER_SITE_OTHER): Payer: 59

## 2016-10-24 VITALS — BP 118/70 | Wt 230.0 lb

## 2016-10-24 DIAGNOSIS — Z363 Encounter for antenatal screening for malformations: Secondary | ICD-10-CM | POA: Diagnosis not present

## 2016-10-24 DIAGNOSIS — O09219 Supervision of pregnancy with history of pre-term labor, unspecified trimester: Secondary | ICD-10-CM

## 2016-10-24 DIAGNOSIS — Z8751 Personal history of pre-term labor: Secondary | ICD-10-CM

## 2016-10-24 DIAGNOSIS — O09529 Supervision of elderly multigravida, unspecified trimester: Secondary | ICD-10-CM

## 2016-10-24 DIAGNOSIS — Z3A21 21 weeks gestation of pregnancy: Secondary | ICD-10-CM

## 2016-10-24 DIAGNOSIS — O09899 Supervision of other high risk pregnancies, unspecified trimester: Secondary | ICD-10-CM

## 2016-10-24 MED ORDER — HYDROXYPROGESTERONE CAPROATE 250 MG/ML IM OIL
250.0000 mg | TOPICAL_OIL | Freq: Once | INTRAMUSCULAR | Status: AC
Start: 2016-10-24 — End: 2016-10-24
  Administered 2016-10-24: 250 mg via INTRAMUSCULAR

## 2016-10-24 NOTE — Progress Notes (Signed)
No vb. No lof.  Anatomy screen complete.  cvx 4.7 cm

## 2016-11-01 ENCOUNTER — Ambulatory Visit (INDEPENDENT_AMBULATORY_CARE_PROVIDER_SITE_OTHER): Payer: 59

## 2016-11-01 DIAGNOSIS — O09212 Supervision of pregnancy with history of pre-term labor, second trimester: Secondary | ICD-10-CM

## 2016-11-01 MED ORDER — HYDROXYPROGESTERONE CAPROATE 250 MG/ML IM OIL
250.0000 mg | TOPICAL_OIL | Freq: Once | INTRAMUSCULAR | Status: AC
  Administered 2016-11-01: 250 mg via INTRAMUSCULAR

## 2016-11-08 ENCOUNTER — Ambulatory Visit (INDEPENDENT_AMBULATORY_CARE_PROVIDER_SITE_OTHER): Payer: 59

## 2016-11-08 DIAGNOSIS — O09212 Supervision of pregnancy with history of pre-term labor, second trimester: Secondary | ICD-10-CM | POA: Diagnosis not present

## 2016-11-08 DIAGNOSIS — Z3A23 23 weeks gestation of pregnancy: Secondary | ICD-10-CM | POA: Diagnosis not present

## 2016-11-08 MED ORDER — HYDROXYPROGESTERONE CAPROATE 250 MG/ML IM OIL
250.0000 mg | TOPICAL_OIL | Freq: Once | INTRAMUSCULAR | Status: AC
  Administered 2016-11-08: 250 mg via INTRAMUSCULAR

## 2016-11-15 ENCOUNTER — Ambulatory Visit (INDEPENDENT_AMBULATORY_CARE_PROVIDER_SITE_OTHER): Payer: 59

## 2016-11-15 DIAGNOSIS — Z8751 Personal history of pre-term labor: Secondary | ICD-10-CM | POA: Diagnosis not present

## 2016-11-15 MED ORDER — HYDROXYPROGESTERONE CAPROATE 250 MG/ML IM OIL
250.0000 mg | TOPICAL_OIL | Freq: Once | INTRAMUSCULAR | Status: AC
  Administered 2016-11-15: 250 mg via INTRAMUSCULAR

## 2016-11-21 ENCOUNTER — Encounter: Payer: Self-pay | Admitting: Obstetrics and Gynecology

## 2016-11-21 ENCOUNTER — Ambulatory Visit (INDEPENDENT_AMBULATORY_CARE_PROVIDER_SITE_OTHER): Payer: 59 | Admitting: Obstetrics and Gynecology

## 2016-11-21 ENCOUNTER — Encounter: Payer: 59 | Admitting: Obstetrics and Gynecology

## 2016-11-21 VITALS — BP 108/60 | Wt 238.0 lb

## 2016-11-21 DIAGNOSIS — O09529 Supervision of elderly multigravida, unspecified trimester: Secondary | ICD-10-CM

## 2016-11-21 DIAGNOSIS — O0990 Supervision of high risk pregnancy, unspecified, unspecified trimester: Secondary | ICD-10-CM

## 2016-11-21 DIAGNOSIS — Z3A24 24 weeks gestation of pregnancy: Secondary | ICD-10-CM | POA: Diagnosis not present

## 2016-11-21 DIAGNOSIS — O09899 Supervision of other high risk pregnancies, unspecified trimester: Secondary | ICD-10-CM

## 2016-11-21 DIAGNOSIS — O09219 Supervision of pregnancy with history of pre-term labor, unspecified trimester: Secondary | ICD-10-CM

## 2016-11-21 DIAGNOSIS — Z8751 Personal history of pre-term labor: Secondary | ICD-10-CM

## 2016-11-21 MED ORDER — HYDROXYPROGESTERONE CAPROATE 250 MG/ML IM OIL
250.0000 mg | TOPICAL_OIL | Freq: Once | INTRAMUSCULAR | Status: AC
  Administered 2016-11-21: 250 mg via INTRAMUSCULAR

## 2016-11-21 MED ORDER — HYDROXYPROGESTERONE CAPROATE 275 MG/1.1ML ~~LOC~~ SOAJ: 275.0000 mg | Freq: Once | SUBCUTANEOUS | Status: DC

## 2016-11-21 NOTE — Progress Notes (Signed)
Pain in lower pain/diarrhea Swelling in feet 17P today

## 2016-11-22 ENCOUNTER — Telehealth: Payer: Self-pay

## 2016-11-22 NOTE — Progress Notes (Signed)
Anatomy scan normal cervical length last visit, given history of preterm labor and symptoms of pressure will obtain follow up cervical length although reassured by cervical exam today

## 2016-11-22 NOTE — Telephone Encounter (Signed)
Rosilyn Mings, RN for [employer], calling. [employer] is very strict. She is asking that the work note written for this patient yesterday be altered. Pt is on mandantory OT & they will not allow her to only work 8 hours as this shows partiality to her for pregnancy which is not considered a disability. She gets breaks every two hours & they accomodate her for sitting when needed, but she will be forced to go home unless the limit shifts to 8 hours is removed. RN states you may want to add to the No prolonged standing "for more than 2 hours" to clarify. She has taking this to legal and these are the suggestions. Please fax updated note to [phone]. Needs ASAP or pt will be sent home & she doesn't have any accrued time to use for being off.

## 2016-11-22 NOTE — Telephone Encounter (Signed)
Per SDJ you can adjust letter as needed for job

## 2016-11-22 NOTE — Telephone Encounter (Signed)
Letter recreated & faxed to Rosilyn Mings @[employer] .

## 2016-11-24 ENCOUNTER — Other Ambulatory Visit: Payer: Self-pay | Admitting: Obstetrics and Gynecology

## 2016-11-24 DIAGNOSIS — O09899 Supervision of other high risk pregnancies, unspecified trimester: Secondary | ICD-10-CM

## 2016-11-24 DIAGNOSIS — Z3A24 24 weeks gestation of pregnancy: Secondary | ICD-10-CM

## 2016-11-24 DIAGNOSIS — O09219 Supervision of pregnancy with history of pre-term labor, unspecified trimester: Secondary | ICD-10-CM

## 2016-11-24 DIAGNOSIS — O0990 Supervision of high risk pregnancy, unspecified, unspecified trimester: Secondary | ICD-10-CM

## 2016-11-24 DIAGNOSIS — O09529 Supervision of elderly multigravida, unspecified trimester: Secondary | ICD-10-CM

## 2016-11-24 DIAGNOSIS — Z8751 Personal history of pre-term labor: Secondary | ICD-10-CM

## 2016-11-28 ENCOUNTER — Ambulatory Visit (INDEPENDENT_AMBULATORY_CARE_PROVIDER_SITE_OTHER): Payer: 59

## 2016-11-28 ENCOUNTER — Ambulatory Visit (INDEPENDENT_AMBULATORY_CARE_PROVIDER_SITE_OTHER): Payer: 59 | Admitting: Obstetrics & Gynecology

## 2016-11-28 VITALS — BP 120/80 | Wt 238.0 lb

## 2016-11-28 DIAGNOSIS — O0990 Supervision of high risk pregnancy, unspecified, unspecified trimester: Secondary | ICD-10-CM

## 2016-11-28 DIAGNOSIS — Z3A26 26 weeks gestation of pregnancy: Secondary | ICD-10-CM

## 2016-11-28 DIAGNOSIS — O09899 Supervision of other high risk pregnancies, unspecified trimester: Secondary | ICD-10-CM

## 2016-11-28 DIAGNOSIS — Z8751 Personal history of pre-term labor: Secondary | ICD-10-CM

## 2016-11-28 DIAGNOSIS — O09529 Supervision of elderly multigravida, unspecified trimester: Secondary | ICD-10-CM

## 2016-11-28 DIAGNOSIS — Z3A24 24 weeks gestation of pregnancy: Secondary | ICD-10-CM | POA: Diagnosis not present

## 2016-11-28 DIAGNOSIS — O09219 Supervision of pregnancy with history of pre-term labor, unspecified trimester: Secondary | ICD-10-CM

## 2016-11-28 MED ORDER — HYDROXYPROGESTERONE CAPROATE 250 MG/ML IM OIL
250.0000 mg | TOPICAL_OIL | Freq: Once | INTRAMUSCULAR | Status: AC
  Administered 2016-11-28: 250 mg via INTRAMUSCULAR

## 2016-11-28 NOTE — Progress Notes (Signed)
[place] discussed, Review of ULTRASOUND.    I have personally reviewed images and report of recent ultrasound done at [facility].    Plan of management to be discussed with patient.    Cervix now 1.9 cm.    Plan is fFN today        Steroids and take out of work if pos.        Cont to monitor for sx's if neg. F/u ine week

## 2016-11-28 NOTE — Patient Instructions (Signed)

## 2016-11-28 NOTE — Addendum Note (Signed)
Addended by: Quintella Baton D on: 11/28/2016 11:48 AM   Modules accepted: Orders

## 2016-11-29 ENCOUNTER — Observation Stay
Admit: 2016-11-29 | Discharge: 2016-11-29 | Disposition: A | Discharge status: Home / Self Care | Payer: 59 | Attending: Obstetrics & Gynecology | Admitting: Obstetrics & Gynecology

## 2016-11-29 ENCOUNTER — Other Ambulatory Visit: Payer: Self-pay | Admitting: Obstetrics & Gynecology

## 2016-11-29 DIAGNOSIS — Z3A26 26 weeks gestation of pregnancy: Secondary | ICD-10-CM | POA: Insufficient documentation

## 2016-11-29 DIAGNOSIS — O09212 Supervision of pregnancy with history of pre-term labor, second trimester: Principal | ICD-10-CM | POA: Insufficient documentation

## 2016-11-29 LAB — FETAL FIBRONECTIN: FETAL FIBRONECTIN: POSITIVE — AB

## 2016-11-29 MED ORDER — BETAMETHASONE SOD PHOS & ACET 6 (3-3) MG/ML IJ SUSP
12.0000 mg | INTRAMUSCULAR | Status: DC
  Administered 2016-11-29: 12 mg via INTRAMUSCULAR

## 2016-11-29 MED ORDER — BETAMETHASONE SOD PHOS & ACET 6 (3-3) MG/ML IJ SUSP
INTRAMUSCULAR | Status: AC
  Administered 2016-11-29: 12 mg via INTRAMUSCULAR
  Filled 2016-11-29: qty 1

## 2016-11-29 NOTE — Discharge Summary (Signed)
See final progress note. 

## 2016-11-29 NOTE — Progress Notes (Signed)
fFN pos so needs to come out of work for a while and also get steroid injections for FLM.  D/w pt.  Orders put in for hospital steroid request.

## 2016-11-29 NOTE — Final Progress Note (Signed)
Final Progress Note  36 y.o. [id number] female at [redacted]w[redacted]d who presents from being sent to Labor and Delivery by Dr. Barnett Applebaum for history of preterm delivery x 2 with cervical shortening as evidenced by ultrasound yesterday to 1.9 cm.  She had a positive fFN, as well.   She notes pelvic pressure that has been present x 2 weeks.  But, denies discrete contractions.  Denies vaginal bleeding and leakage of fluid. She notes +FM.    BP (!) 119/56 (BP Location: Right Arm)   Pulse 92   Temp 98.2 F (36.8 C) (Oral)   Resp 18   Ht 5\' 7"  (1.702 m)   Wt 238 lb (108 kg)   LMP 06/03/2016 (Approximate)   SpO2 100%   BMI 37.28 kg/m   Gen: NAD Abd: gravid, NT  Tocometry: quiet  A/P: 36 y.o. G5P0222 female with history of preterm delivery x 2 with recent shortened cervix and positive fFN.  - betamethasone 12 mg IM today and repeat dose in 24 hours. - preterm labor precautions.   - follow up in clinic either late this week or early next week.  Prentice Docker, MD 11/29/2016 3:59 PM

## 2016-11-29 NOTE — OB Triage Note (Signed)
Pt sent from office for NST and betamethasone. Pt denies any pain, NVD, bleeding, or LOF. Will continue to monitor. Vitals WNL

## 2016-11-30 ENCOUNTER — Inpatient Hospital Stay
Admission: RE | Admit: 2016-11-30 | Discharge: 2016-11-30 | Disposition: A | Discharge status: Home / Self Care | Payer: 59 | Attending: Obstetrics and Gynecology | Admitting: Obstetrics and Gynecology

## 2016-11-30 DIAGNOSIS — O4702 False labor before 37 completed weeks of gestation, second trimester: Secondary | ICD-10-CM | POA: Insufficient documentation

## 2016-11-30 DIAGNOSIS — Z3A26 26 weeks gestation of pregnancy: Secondary | ICD-10-CM | POA: Insufficient documentation

## 2016-11-30 DIAGNOSIS — O26879 Cervical shortening, unspecified trimester: Secondary | ICD-10-CM | POA: Diagnosis present

## 2016-11-30 DIAGNOSIS — O09899 Supervision of other high risk pregnancies, unspecified trimester: Secondary | ICD-10-CM

## 2016-11-30 DIAGNOSIS — O09219 Supervision of pregnancy with history of pre-term labor, unspecified trimester: Secondary | ICD-10-CM

## 2016-11-30 MED ORDER — BETAMETHASONE SOD PHOS & ACET 6 (3-3) MG/ML IJ SUSP
12.0000 mg | Freq: Once | INTRAMUSCULAR | Status: AC
  Administered 2016-11-30: 12 mg via INTRAMUSCULAR

## 2016-12-01 ENCOUNTER — Encounter: Payer: Self-pay | Admitting: Obstetrics & Gynecology

## 2016-12-02 NOTE — Telephone Encounter (Signed)
What to do with this?!

## 2016-12-05 ENCOUNTER — Ambulatory Visit (INDEPENDENT_AMBULATORY_CARE_PROVIDER_SITE_OTHER): Payer: 59 | Admitting: Obstetrics & Gynecology

## 2016-12-05 VITALS — BP 102/60 | Wt 238.0 lb

## 2016-12-05 DIAGNOSIS — O09219 Supervision of pregnancy with history of pre-term labor, unspecified trimester: Secondary | ICD-10-CM

## 2016-12-05 DIAGNOSIS — O0990 Supervision of high risk pregnancy, unspecified, unspecified trimester: Secondary | ICD-10-CM

## 2016-12-05 DIAGNOSIS — O09529 Supervision of elderly multigravida, unspecified trimester: Secondary | ICD-10-CM

## 2016-12-05 DIAGNOSIS — O26879 Cervical shortening, unspecified trimester: Secondary | ICD-10-CM

## 2016-12-05 DIAGNOSIS — Z3A27 27 weeks gestation of pregnancy: Secondary | ICD-10-CM

## 2016-12-05 DIAGNOSIS — O09899 Supervision of other high risk pregnancies, unspecified trimester: Secondary | ICD-10-CM

## 2016-12-05 MED ORDER — HYDROXYPROGESTERONE CAPROATE 250 MG/ML IM OIL
250.0000 mg | TOPICAL_OIL | Freq: Once | INTRAMUSCULAR | Status: AC
  Administered 2016-12-05: 250 mg via INTRAMUSCULAR

## 2016-12-05 NOTE — Addendum Note (Signed)
Addended by: Quintella Baton D on: 12/05/2016 10:21 AM   Modules accepted: Orders

## 2016-12-05 NOTE — Patient Instructions (Signed)
Preventing Preterm Birth Preterm birth is when your baby is delivered between 72 weeks and 37 weeks of pregnancy. A full-term pregnancy lasts for at least 37 weeks. Preterm birth can be dangerous for your baby because the last few weeks of pregnancy are an important time for your baby's brain and lungs to grow. Many things can cause a baby to be born early. Sometimes the cause is not known. There are certain factors that make you more likely to experience preterm birth, such as:  Having a previous baby born preterm.  Being pregnant with twins or other multiples.  Having had fertility treatment.  Being overweight or underweight at the start of your pregnancy.  Having any of the following during pregnancy: ? An infection, including a urinary tract infection (UTI) or an STI (sexually transmitted infection). ? High blood pressure. ? Diabetes. ? Vaginal bleeding.  Being age 38 or older.  Being age 50 or younger.  Getting pregnant within 6 months of a previous pregnancy.  Suffering extreme stress or physical or emotional abuse during pregnancy.  Standing for long periods of time during pregnancy, such as working at a job that requires standing.  What are the risks? The most serious risk of preterm birth is that the baby may not survive. This is more likely to happen if a baby is born before 13 weeks. Other risks and complications of preterm birth may include your baby having:  Breathing problems.  Brain damage that affects movement and coordination (cerebral palsy).  Feeding difficulties.  Vision or hearing problems.  Infections or inflammation of the digestive tract (colitis).  Developmental delays.  Learning disabilities.  Higher risk for diabetes, heart disease, and high blood pressure later in life.  What can I do to lower my risk? Medical care  The most important thing you can do to lower your risk for preterm birth is to get routine medical care during pregnancy  (prenatal care). If you have a high risk of preterm birth, you may be referred to a health care provider who specializes in managing high-risk pregnancies (perinatologist). You may be given medicine to help prevent preterm birth. Lifestyle changes Certain lifestyle changes can also lower your risk of preterm birth:  Wait at least 6 months after a pregnancy to become pregnant again.  Try to plan pregnancy for when you are between 57 and 77 years old.  Get to a healthy weight before getting pregnant. If you are overweight, work with your health care provider to safely lose weight.  Do not use any products that contain nicotine or tobacco, such as cigarettes and e-cigarettes. If you need help quitting, ask your health care provider.  Do not drink alcohol.  Do not use drugs.  Where to find support: For more support, consider:  Talking with your health care provider.  Talking with a therapist or substance abuse counselor, if you need help quitting.  Working with a diet and nutrition specialist (dietitian) or a Physiological scientist to maintain a healthy weight.  Joining a support group.  Where to find more information: Learn more about preventing preterm birth from:  Centers for Disease Control and Prevention: VoipObserver.com.br  March of Dimes: marchofdimes.org/complications/premature-babies.aspx  American Pregnancy Association: americanpregnancy.org/labor-and-birth/premature-labor  Contact a health care provider if:  You have any of the following signs of preterm labor before 37 weeks: ? A change or increase in vaginal discharge. ? Fluid leaking from your vagina. ? Pressure or cramps in your lower abdomen. ? A backache that does not  go away or gets worse. °? Regular tightening (contractions) in your lower abdomen. °Summary °· Preterm birth means having your baby during weeks 20-37 of pregnancy. °· Preterm birth may put your baby at risk  for physical and mental problems. °· Getting good prenatal care can help prevent preterm birth. °· You can lower your risk of preterm birth by making certain lifestyle changes, such as not smoking and not using alcohol. °This information is not intended to replace advice given to you by your health care provider. Make sure you discuss any questions you have with your health care provider. °Document Released: 05/19/2015 Document Revised: 12/12/2015 Document Reviewed: 12/12/2015 °Elsevier Interactive Patient Education © 2018 Elsevier Inc. ° °

## 2016-12-05 NOTE — Progress Notes (Signed)
Prenatal Visit Note Date: 12/05/2016 Clinic: [facility] Subjective:  [name] is a 36 y.o. K3T4656 at [redacted]w[redacted]d being seen today for ongoing prenatal care.  She is currently monitored for the following issues for this high-risk pregnancy and has Pregnancy, supervision, high-risk, unspecified trimester; Antepartum multigravida of advanced maternal age; History of preterm delivery, currently pregnant; Preterm labor; and Antepartum cervical shortening on her problem list.  PTL RISKS-    [place]- cervix 1.9 cm last week, fFN POS last week, BMZ given x2 last week.    Out of work now, modifed bed rest and complete pelvic rest.    Prior deliveries at 23 and 35 weeks.    Still on 17P injections weekly  Patient reports some crampiness but no ctxs, VB or ROM.Marland Kitchen   Contractions: Not present. Vag. Bleeding: None.  Movement: Present. Denies leaking of fluid.   The following portions of the patient's history were reviewed and updated as appropriate: allergies, current medications, past family history, past medical history, past social history, past surgical history and problem list. Problem list updated.  Objective:   Vitals:   12/05/16 0928  BP: 102/60  Weight: 238 lb (108 kg)   Fetal Status:     Movement: Present     General:  Alert, oriented and cooperative. Patient is in no acute distress.  Skin: Skin is warm and dry. No rash noted.   Abdomen: Soft, gravid, appropriate for gestational age. Pain/Pressure: Present     Pelvic:  Cervical exam deferred        Extremities: Normal range of motion.     Mental Status: Normal mood and affect. Normal behavior. Normal judgment and thought content.   Urinalysis: Urine Protein: Negative Urine Glucose: Negative  Assessment and Plan:  Pregnancy: [id number] at [redacted]w[redacted]d  1. [redacted] weeks gestation of pregnancy  2. Pregnancy, supervision, high-risk, unspecified trimester  3. History of preterm delivery, currently pregnant Cont bed and pelvis rest [place] and fFN nv for  re-evaluation 17P weekly - US OB Follow Up; Future  4. Antepartum multigravida of advanced maternal age  26. Antepartum cervical shortening - US OB Follow Up; Future  Preterm labor symptoms and general obstetric precautions including but not limited to vaginal bleeding, contractions, leaking of fluid and fetal movement were reviewed in detail with the patient. Please refer to After Visit Summary for other counseling recommendations.  Return in about 1 week (around 12/12/2016) for HROB. Glucola in 2 weeks as scheduled  Barnett Applebaum, MD, [facility], [facility] 12/05/2016  9:49 AM

## 2016-12-06 ENCOUNTER — Telehealth: Payer: Self-pay

## 2016-12-06 NOTE — Telephone Encounter (Signed)
FMLA/DISABILITY form for [employer] filled out and given to [place] for processing. 

## 2016-12-12 ENCOUNTER — Other Ambulatory Visit: Payer: Self-pay | Admitting: Obstetrics & Gynecology

## 2016-12-12 ENCOUNTER — Ambulatory Visit (INDEPENDENT_AMBULATORY_CARE_PROVIDER_SITE_OTHER): Payer: 59 | Admitting: Advanced Practice Midwife

## 2016-12-12 ENCOUNTER — Ambulatory Visit (INDEPENDENT_AMBULATORY_CARE_PROVIDER_SITE_OTHER): Payer: 59

## 2016-12-12 ENCOUNTER — Encounter: Payer: Self-pay | Admitting: Advanced Practice Midwife

## 2016-12-12 VITALS — BP 108/68 | Wt 236.0 lb

## 2016-12-12 DIAGNOSIS — O26879 Cervical shortening, unspecified trimester: Secondary | ICD-10-CM

## 2016-12-12 DIAGNOSIS — O09219 Supervision of pregnancy with history of pre-term labor, unspecified trimester: Secondary | ICD-10-CM

## 2016-12-12 DIAGNOSIS — Z3A28 28 weeks gestation of pregnancy: Secondary | ICD-10-CM

## 2016-12-12 DIAGNOSIS — Z8751 Personal history of pre-term labor: Secondary | ICD-10-CM

## 2016-12-12 DIAGNOSIS — O09899 Supervision of other high risk pregnancies, unspecified trimester: Secondary | ICD-10-CM

## 2016-12-12 MED ORDER — HYDROXYPROGESTERONE CAPROATE 250 MG/ML IM OIL
250.0000 mg | TOPICAL_OIL | Freq: Once | INTRAMUSCULAR | Status: AC
  Administered 2016-12-12: 250 mg via INTRAMUSCULAR

## 2016-12-12 NOTE — Progress Notes (Signed)
Follow up U/S today for cervical length: 1.5 cm, no change with fundal pressure.  Growth is 2 pounds 14 oz, 61.3%, AFI 10.65 cm.  Fetal Fibronectin collected today.  Patient states she is generally feeling better now with less swelling and pressure.  She admits adequate hydration and good appetite/healthy foods. She denies contractions, leakage of fluid, vaginal bleeding Continued bed/pelvic rest, weekly 17P. Will do glucola nv.

## 2016-12-12 NOTE — Progress Notes (Signed)
17P today U/S today

## 2016-12-13 ENCOUNTER — Telehealth: Payer: Self-pay

## 2016-12-13 LAB — FETAL FIBRONECTIN: FETAL FIBRONECTIN: NEGATIVE

## 2016-12-13 NOTE — Telephone Encounter (Signed)
FMLA/DISABILITY filled out for [employer] filled out for spouse and given to [place] for processing.

## 2016-12-13 NOTE — Telephone Encounter (Signed)
FMLA/DISABILITY form for [employer] filled out and given to [place] for processing.

## 2016-12-20 ENCOUNTER — Other Ambulatory Visit: Payer: 59

## 2016-12-20 ENCOUNTER — Ambulatory Visit (INDEPENDENT_AMBULATORY_CARE_PROVIDER_SITE_OTHER): Payer: 59 | Admitting: Obstetrics & Gynecology

## 2016-12-20 VITALS — BP 120/70 | Wt 235.0 lb

## 2016-12-20 DIAGNOSIS — Z3A29 29 weeks gestation of pregnancy: Secondary | ICD-10-CM

## 2016-12-20 DIAGNOSIS — Z8751 Personal history of pre-term labor: Secondary | ICD-10-CM

## 2016-12-20 DIAGNOSIS — O09213 Supervision of pregnancy with history of pre-term labor, third trimester: Secondary | ICD-10-CM

## 2016-12-20 DIAGNOSIS — O09529 Supervision of elderly multigravida, unspecified trimester: Secondary | ICD-10-CM

## 2016-12-20 DIAGNOSIS — O09219 Supervision of pregnancy with history of pre-term labor, unspecified trimester: Secondary | ICD-10-CM

## 2016-12-20 DIAGNOSIS — O0990 Supervision of high risk pregnancy, unspecified, unspecified trimester: Secondary | ICD-10-CM

## 2016-12-20 DIAGNOSIS — O09899 Supervision of other high risk pregnancies, unspecified trimester: Secondary | ICD-10-CM

## 2016-12-20 DIAGNOSIS — O26879 Cervical shortening, unspecified trimester: Secondary | ICD-10-CM

## 2016-12-20 MED ORDER — HYDROXYPROGESTERONE CAPROATE 250 MG/ML IM OIL
250.0000 mg | TOPICAL_OIL | Freq: Once | INTRAMUSCULAR | Status: AC
  Administered 2016-12-20: 250 mg via INTRAMUSCULAR

## 2016-12-20 NOTE — Patient Instructions (Signed)

## 2016-12-20 NOTE — Addendum Note (Signed)
Addended by: Quintella Baton D on: 12/20/2016 09:36 AM   Modules accepted: Orders

## 2016-12-20 NOTE — Progress Notes (Signed)
Prenatal Visit Note Date: 12/20/2016 Clinic: [facility]  Subjective:  [name] is a 36 y.o. [id number] at [redacted]w[redacted]d being seen today for ongoing prenatal care.  She is currently monitored for the following issues for this high-risk pregnancy and has Pregnancy, supervision, high-risk, unspecified trimester; Antepartum multigravida of advanced maternal age; History of preterm delivery, currently pregnant; Preterm labor; and Antepartum cervical shortening on her problem list.  Patient reports occas cramping this past weekend but otherwise has had min complainbts since coming out of work due to RF for PTL.  She had pos cervical shortening and pos fFN 2 weeks ago, then neg fFN one week ago.  BMZ given.  Prior PTD x2, on 17-P weekly..   Contractions: Not present. Vag. Bleeding: None.  Movement: Present. Denies leaking of fluid.   The following portions of the patient's history were reviewed and updated as appropriate: allergies, current medications, past family history, past medical history, past social history, past surgical history and problem list. Problem list updated.  Objective:   Vitals:   12/20/16 0831  BP: 120/70  Weight: 235 lb (106.6 kg)    Fetal Status:     Movement: Present     General:  Alert, oriented and cooperative. Patient is in no acute distress.  Skin: Skin is warm and dry. No rash noted.   Cardiovascular: Normal heart rate noted  Respiratory: Normal respiratory effort, no problems with respiration noted  Abdomen: Soft, gravid, appropriate for gestational age. Pain/Pressure: Present     Pelvic:  Cervical exam deferred        Extremities: Normal range of motion.     Mental Status: Normal mood and affect. Normal behavior. Normal judgment and thought content.   Urinalysis: Urine Protein: Negative Urine Glucose: Negative  Assessment and Plan:  Pregnancy: [id number] at [redacted]w[redacted]d  1. [redacted] weeks gestation of pregnancy - Glucola today  2. Pregnancy, supervision, high-risk, unspecified  trimester - US OB Follow Up; Future  3. History of preterm delivery, currently pregnant - Cont 17 P.  Repeat testing as warranted. - US OB Follow Up; Future  4. Antepartum multigravida of advanced maternal age - Normal XY - US OB Follow Up; Future  5. Antepartum cervical shortening - Pelvic rest - US OB Follow Up; Future  6. Plans Minipill then Xulane patch for [facility]  Preterm labor symptoms and general obstetric precautions including but not limited to vaginal bleeding, contractions, leaking of fluid and fetal movement were reviewed in detail with the patient. Please refer to After Visit Summary for other counseling recommendations.  Return in about 1 week (around 12/27/2016) for Injection appt 1 week and HROB/Limited OB US 2 weeks.  Barnett Applebaum, MD, [facility], [facility] 12/20/2016  8:51 AM

## 2016-12-21 ENCOUNTER — Encounter: Payer: Self-pay | Admitting: Obstetrics and Gynecology

## 2016-12-21 LAB — 28 WEEK RH+PANEL
BASOS ABS: 0 10*3/uL (ref 0.0–0.2)
Basos: 0 %
EOS (ABSOLUTE): 0.1 10*3/uL (ref 0.0–0.4)
EOS: 1 %
Gestational Diabetes Screen: 124 mg/dL (ref 65–139)
HEMATOCRIT: 34.7 % (ref 34.0–46.6)
HIV Screen 4th Generation wRfx: NONREACTIVE
Hemoglobin: 11.6 g/dL (ref 11.1–15.9)
IMMATURE GRANS (ABS): 0 10*3/uL (ref 0.0–0.1)
Immature Granulocytes: 0 %
LYMPHS ABS: 1.6 10*3/uL (ref 0.7–3.1)
LYMPHS: 25 %
MCH: 30.2 pg (ref 26.6–33.0)
MCHC: 33.4 g/dL (ref 31.5–35.7)
MCV: 90 fL (ref 79–97)
MONOCYTES: 6 %
Monocytes Absolute: 0.4 10*3/uL (ref 0.1–0.9)
NEUTROS ABS: 4.4 10*3/uL (ref 1.4–7.0)
Neutrophils: 68 %
PLATELETS: 237 10*3/uL (ref 150–379)
RBC: 3.84 x10E6/uL (ref 3.77–5.28)
RDW: 14.6 % (ref 12.3–15.4)
RPR Ser Ql: NONREACTIVE
WBC: 6.5 10*3/uL (ref 3.4–10.8)

## 2016-12-22 ENCOUNTER — Telehealth: Payer: Self-pay

## 2016-12-22 NOTE — Telephone Encounter (Signed)
FMLA/DISABILITY form for pt's employer filled out and given to [place] for processing.

## 2016-12-27 ENCOUNTER — Ambulatory Visit (INDEPENDENT_AMBULATORY_CARE_PROVIDER_SITE_OTHER): Payer: 59

## 2016-12-27 DIAGNOSIS — O09219 Supervision of pregnancy with history of pre-term labor, unspecified trimester: Secondary | ICD-10-CM

## 2016-12-27 DIAGNOSIS — Z8751 Personal history of pre-term labor: Secondary | ICD-10-CM

## 2016-12-27 MED ORDER — HYDROXYPROGESTERONE CAPROATE 250 MG/ML IM OIL
250.0000 mg | TOPICAL_OIL | Freq: Once | INTRAMUSCULAR | Status: AC
  Administered 2016-12-27: 250 mg via INTRAMUSCULAR

## 2017-01-04 ENCOUNTER — Other Ambulatory Visit: Payer: 59

## 2017-01-04 ENCOUNTER — Encounter: Payer: 59 | Admitting: Obstetrics and Gynecology

## 2017-01-05 ENCOUNTER — Ambulatory Visit (INDEPENDENT_AMBULATORY_CARE_PROVIDER_SITE_OTHER): Payer: 59 | Admitting: Maternal Newborn

## 2017-01-05 ENCOUNTER — Ambulatory Visit (INDEPENDENT_AMBULATORY_CARE_PROVIDER_SITE_OTHER): Payer: 59

## 2017-01-05 ENCOUNTER — Other Ambulatory Visit: Payer: Self-pay | Admitting: Obstetrics & Gynecology

## 2017-01-05 VITALS — BP 118/60 | Wt 239.0 lb

## 2017-01-05 DIAGNOSIS — O09529 Supervision of elderly multigravida, unspecified trimester: Secondary | ICD-10-CM

## 2017-01-05 DIAGNOSIS — O26879 Cervical shortening, unspecified trimester: Secondary | ICD-10-CM

## 2017-01-05 DIAGNOSIS — O09899 Supervision of other high risk pregnancies, unspecified trimester: Secondary | ICD-10-CM

## 2017-01-05 DIAGNOSIS — O0990 Supervision of high risk pregnancy, unspecified, unspecified trimester: Secondary | ICD-10-CM

## 2017-01-05 DIAGNOSIS — O09219 Supervision of pregnancy with history of pre-term labor, unspecified trimester: Secondary | ICD-10-CM

## 2017-01-05 DIAGNOSIS — Z3A29 29 weeks gestation of pregnancy: Secondary | ICD-10-CM

## 2017-01-05 DIAGNOSIS — Z3A31 31 weeks gestation of pregnancy: Secondary | ICD-10-CM

## 2017-01-05 MED ORDER — HYDROXYPROGESTERONE CAPROATE 250 MG/ML IM OIL
250.0000 mg | TOPICAL_OIL | Freq: Once | INTRAMUSCULAR | Status: AC
  Administered 2017-01-05: 250 mg via INTRAMUSCULAR

## 2017-01-05 NOTE — Progress Notes (Signed)
Routine Prenatal Care Visit  Subjective  [name] is a 36 y.o. [id number] at [redacted]w[redacted]d being seen today for ongoing prenatal care.  She is currently monitored for the following issues for this high-risk pregnancy and has Pregnancy, supervision, high-risk, unspecified trimester; Antepartum multigravida of advanced maternal age; History of preterm delivery, currently pregnant; Preterm labor; and Antepartum cervical shortening on her problem list.  ----------------------------------------------------------------------------------- Patient reports occasional pelvic pressure. She rests with her feet elevated when this happens and it is relieved after resting. Contractions: Not present. Vag. Bleeding: None.  Movement: Present. Denies leaking of fluid.  ----------------------------------------------------------------------------------- The following portions of the patient's history were reviewed and updated as appropriate: allergies, current medications, past family history, past medical history, past social history, past surgical history and problem list. Problem list updated.   Objective  Blood pressure 118/60, weight 239 lb (108.4 kg), last menstrual period 06/03/2016. Pregravid weight 195 lb (88.5 kg) Total Weight Gain 44 lb (20 kg) Urinalysis: Urine Protein: Negative Urine Glucose: Negative  Fetal Status: Fetal Heart Rate (bpm): 140 Fundal Height: 29 cm Movement: Present  Presentation: Vertex   EFW by ultrasound: 3 lb 14 oz (43rd %tile)  General:  Alert, oriented and cooperative. Patient is in no acute distress.  Skin: Skin is warm and dry. No rash noted.   Cardiovascular: Normal heart rate noted  Respiratory: Normal respiratory effort, no problems with respiration noted  Abdomen: Soft, gravid, appropriate for gestational age. Pain/Pressure: Present     Pelvic:  Cervical exam deferred        Extremities: Normal range of motion.     Mental Status: Normal mood and affect. Normal behavior.  Normal judgment and thought content.     Assessment   37 y.o. [id number] at [redacted]w[redacted]d by  03/04/2017, by Ultrasound presenting for routine prenatal visit  Plan   pregnancy 5 Problems (from 06/03/16 to present)    Problem Noted Resolved   History of preterm delivery, currently pregnant 08/24/2016 by Malachy Mood, MD No   Overview Signed 11/29/2016  4:01 PM by Will Bonnet, MD    - shortened cervix at 26 weeks (1.9 cm), +fFN - s/p BMTZ course on 8/14 and 8/15      Pregnancy, supervision, high-risk, unspecified trimester 07/27/2016 by Rod Can, CNM No   Overview Addendum 12/21/2016 10:25 AM by Malachy Mood, MD    Clinic [facility] Prenatal Labs  Dating 8 week [place] Blood type:   B pos  Genetic Screen  NIPS: Normal XY Antibody: neg  Anatomic [place]  Rubella:   Immune Varicella: Immune  GTT Third trimester: 124 RPR:   NR  Rhogam  HBsAg:   neg  TDaP vaccine                       Flu Shot: HIV:   neg  Baby Food                                GBS:   Contraception  Pap:  CBB     CS/VBAC    Support Person               Antepartum multigravida of advanced maternal age 30/02/2017 by Rod Can, CNM No    Continue pelvic rest.  Ultrasound today showed cervical length 1.46 cm with funneling, unchanged with fundal pressure. AFI = 15.66 cm.  17P today.  Preterm labor symptoms and general  obstetric precautions including but not limited to vaginal bleeding, contractions, leaking of fluid and fetal movement were reviewed in detail with the patient.  Return in about 2 weeks (around 01/19/2017) for ROB.  Avel Sensor, CNM 01/05/2017  12:38 PM

## 2017-01-12 ENCOUNTER — Telehealth: Payer: Self-pay

## 2017-01-12 ENCOUNTER — Ambulatory Visit (INDEPENDENT_AMBULATORY_CARE_PROVIDER_SITE_OTHER): Payer: 59

## 2017-01-12 DIAGNOSIS — O09213 Supervision of pregnancy with history of pre-term labor, third trimester: Secondary | ICD-10-CM | POA: Diagnosis not present

## 2017-01-12 DIAGNOSIS — O09893 Supervision of other high risk pregnancies, third trimester: Secondary | ICD-10-CM

## 2017-01-12 MED ORDER — HYDROXYPROGESTERONE CAPROATE 250 MG/ML IM OIL
250.0000 mg | TOPICAL_OIL | Freq: Once | INTRAMUSCULAR | Status: AC
  Administered 2017-01-12: 250 mg via INTRAMUSCULAR

## 2017-01-12 NOTE — Telephone Encounter (Signed)
FMLA/DISABILITY update form for [employer] filled out and given to [place] for processing.

## 2017-01-12 NOTE — Progress Notes (Signed)
Pt here for 17P inj which was given IM left glut.  NDC# [phone]

## 2017-01-13 ENCOUNTER — Ambulatory Visit (INDEPENDENT_AMBULATORY_CARE_PROVIDER_SITE_OTHER): Payer: 59 | Admitting: Obstetrics and Gynecology

## 2017-01-13 ENCOUNTER — Telehealth: Payer: Self-pay

## 2017-01-13 VITALS — BP 124/78 | Wt 238.0 lb

## 2017-01-13 DIAGNOSIS — R197 Diarrhea, unspecified: Secondary | ICD-10-CM | POA: Diagnosis not present

## 2017-01-13 DIAGNOSIS — O09219 Supervision of pregnancy with history of pre-term labor, unspecified trimester: Secondary | ICD-10-CM

## 2017-01-13 DIAGNOSIS — O0990 Supervision of high risk pregnancy, unspecified, unspecified trimester: Secondary | ICD-10-CM

## 2017-01-13 DIAGNOSIS — Z3A32 32 weeks gestation of pregnancy: Secondary | ICD-10-CM

## 2017-01-13 DIAGNOSIS — O26879 Cervical shortening, unspecified trimester: Secondary | ICD-10-CM

## 2017-01-13 DIAGNOSIS — O09529 Supervision of elderly multigravida, unspecified trimester: Secondary | ICD-10-CM

## 2017-01-13 DIAGNOSIS — O09899 Supervision of other high risk pregnancies, unspecified trimester: Secondary | ICD-10-CM

## 2017-01-13 NOTE — Telephone Encounter (Signed)
Spoke w/pt. She hasn't eaten anything out of the ordinary or been around anyone w/stomach virus that she is aware of. Just now trying to eat something. Given h/o preterm pt prefers to be checked out. Apt scheduled w/SDJ today @2 :30.

## 2017-01-13 NOTE — Telephone Encounter (Signed)
Pt states she woke up this a.m. W/watery diarrhea. She has gone back to bed & put her feet up. Not a lot of pain, just some of the same pain, a little in her back. Doesn't feel like labor pain.

## 2017-01-13 NOTE — Progress Notes (Signed)
Routine Prenatal Care Visit  Subjective  [name] is a 36 y.o. [id number] at [redacted]w[redacted]d being seen today for ongoing prenatal care.  She is currently monitored for the following issues for this high-risk pregnancy and has Pregnancy, supervision, high-risk, unspecified trimester; Antepartum multigravida of advanced maternal age; History of preterm delivery, currently pregnant; Preterm labor; and Antepartum cervical shortening on her problem list.  ----------------------------------------------------------------------------------- Patient reports diarrhea today x 2 and pelvic pressure.  Denies blood in stool, vaginal bleeding, blood in urine.  Denies fevers, chills, nausea/emesis. Denies sick contacts.  She was concerned that the diarrhea might mean that she was laboring.   Contractions: Not present. Vag. Bleeding: None.  Movement: Present. Denies leaking of fluid.  ----------------------------------------------------------------------------------- The following portions of the patient's history were reviewed and updated as appropriate: allergies, current medications, past family history, past medical history, past social history, past surgical history and problem list. Problem list updated.  Objective  Blood pressure 124/78, weight 238 lb (108 kg), last menstrual period 06/03/2016. Pregravid weight 195 lb (88.5 kg) Total Weight Gain 43 lb (19.5 kg) Urinalysis: Urine Protein: Negative Urine Glucose: Negative  Fetal Status: Fetal Heart Rate (bpm): 140   Movement: Present     General:  Alert, oriented and cooperative. Patient is in no acute distress.  Skin: Skin is warm and dry. No rash noted.   Cardiovascular: Normal heart rate noted  Respiratory: Normal respiratory effort, no problems with respiration noted  Abdomen: Soft, gravid, appropriate for gestational age. Pain/Pressure: Present     Pelvic:  Cervical exam performed Dilation: Closed Effacement (%): 30 Station: -3  Extremities: Normal range  of motion.     Mental Status: Normal mood and affect. Normal behavior. Normal judgment and thought content.   Wet Prep: PH: 4.5 Clue Cells: Negative Fungal elements: Negative Trichomonas: Negative  Assessment   36 y.o. [id number] at [redacted]w[redacted]d by  03/04/2017, by Ultrasound presenting for work-in prenatal visit.  Diagnosis of diarrhea of unspecified origin.  No evidence of preterm labor today.  Continue with current plan of care. Patient reassured and encouraged to follow up for any concerning symptoms given her history.   Plan   pregnancy 5 Problems (from 06/03/16 to present)    Problem Noted Resolved   History of preterm delivery, currently pregnant 08/24/2016 by Malachy Mood, MD No   Overview Signed 11/29/2016  4:01 PM by Will Bonnet, MD    - shortened cervix at 26 weeks (1.9 cm), +fFN - s/p BMTZ course on 8/14 and 8/15      Pregnancy, supervision, high-risk, unspecified trimester 07/27/2016 by Rod Can, CNM No   Overview Addendum 12/21/2016 10:25 AM by Malachy Mood, MD    Clinic [facility] Prenatal Labs  Dating 8 week [place] Blood type:   B pos  Genetic Screen  NIPS: Normal XY Antibody: neg  Anatomic [place]  Rubella:   Immune Varicella: Immune  GTT Third trimester: 124 RPR:   NR  Rhogam  HBsAg:   neg  TDaP vaccine                       Flu Shot: HIV:   neg  Baby Food                                GBS:   Contraception  Pap:  CBB     CS/VBAC    Support Person  Antepartum multigravida of advanced maternal age 33/02/2017 by Rod Can, CNM No      Preterm labor symptoms and general obstetric precautions including but not limited to vaginal bleeding, contractions, leaking of fluid and fetal movement were reviewed in detail with the patient. Please refer to After Visit Summary for other counseling recommendations.   Return if symptoms worsen or fail to improve, for Keep previously scheduled Routine prenatal appointment.  Prentice Docker, MD  01/15/2017  5:48 PM

## 2017-01-15 LAB — POCT WET PREP WITH KOH
Clue Cells Wet Prep HPF POC: NEGATIVE
PH, VAGINAL: 4.5
TRICHOMONAS UA: NEGATIVE
Yeast Wet Prep HPF POC: NEGATIVE

## 2017-01-20 ENCOUNTER — Ambulatory Visit (INDEPENDENT_AMBULATORY_CARE_PROVIDER_SITE_OTHER): Payer: 59 | Admitting: Obstetrics and Gynecology

## 2017-01-20 VITALS — BP 118/64 | Wt 238.0 lb

## 2017-01-20 DIAGNOSIS — Z23 Encounter for immunization: Secondary | ICD-10-CM

## 2017-01-20 DIAGNOSIS — O09219 Supervision of pregnancy with history of pre-term labor, unspecified trimester: Secondary | ICD-10-CM | POA: Diagnosis not present

## 2017-01-20 DIAGNOSIS — Z3A33 33 weeks gestation of pregnancy: Secondary | ICD-10-CM

## 2017-01-20 DIAGNOSIS — O09529 Supervision of elderly multigravida, unspecified trimester: Secondary | ICD-10-CM

## 2017-01-20 DIAGNOSIS — O26879 Cervical shortening, unspecified trimester: Secondary | ICD-10-CM

## 2017-01-20 DIAGNOSIS — O09899 Supervision of other high risk pregnancies, unspecified trimester: Secondary | ICD-10-CM

## 2017-01-20 DIAGNOSIS — O2603 Excessive weight gain in pregnancy, third trimester: Secondary | ICD-10-CM

## 2017-01-20 DIAGNOSIS — O0990 Supervision of high risk pregnancy, unspecified, unspecified trimester: Secondary | ICD-10-CM

## 2017-01-20 MED ORDER — HYDROXYPROGESTERONE CAPROATE 250 MG/ML IM OIL
250.0000 mg | TOPICAL_OIL | Freq: Once | INTRAMUSCULAR | Status: AC
  Administered 2017-01-20: 250 mg via INTRAMUSCULAR

## 2017-01-20 NOTE — Progress Notes (Signed)
Routine Prenatal Care Visit  Subjective  [name] is a 36 y.o. [id number] at [redacted]w[redacted]d being seen today for ongoing prenatal care.  She is currently monitored for the following issues for this high-risk pregnancy and has Pregnancy, supervision, high-risk, unspecified trimester; Antepartum multigravida of advanced maternal age; History of preterm delivery, currently pregnant; Preterm labor; and Antepartum cervical shortening on her problem list.  ----------------------------------------------------------------------------------- Patient reports no complaints.   Contractions: Not present. Vag. Bleeding: None.  Movement: Present. Denies leaking of fluid.  ----------------------------------------------------------------------------------- The following portions of the patient's history were reviewed and updated as appropriate: allergies, current medications, past family history, past medical history, past social history, past surgical history and problem list. Problem list updated.   Objective  Last menstrual period 06/03/2016. Pregravid weight 195 lb (88.5 kg) Total Weight Gain 43 lb (19.5 kg)  Body mass index is 37.28 kg/m.  Urinalysis:      Fetal Status: Fetal Heart Rate (bpm): 155 Fundal Height: 33 cm Movement: Present  Presentation: Vertex  General:  Alert, oriented and cooperative. Patient is in no acute distress.  Skin: Skin is warm and dry. No rash noted.   Cardiovascular: Normal heart rate noted  Respiratory: Normal respiratory effort, no problems with respiration noted  Abdomen: Soft, gravid, appropriate for gestational age. Pain/Pressure: Present     Pelvic:  Cervical exam deferred        Extremities: Normal range of motion.     ental Status: Normal mood and affect. Normal behavior. Normal judgment and thought content.     Assessment   36 y.o. [id number] at [redacted]w[redacted]d by  03/04/2017, by Ultrasound presenting for routine prenatal visit  Plan   pregnancy 5 Problems (from  06/03/16 to present)    Problem Noted Resolved   History of preterm delivery, currently pregnant 08/24/2016 by Malachy Mood, MD No   Overview Signed 11/29/2016  4:01 PM by Will Bonnet, MD    - shortened cervix at 26 weeks (1.9 cm), +fFN - s/p BMTZ course on 8/14 and 8/15      Pregnancy, supervision, high-risk, unspecified trimester 07/27/2016 by Rod Can, CNM No   Overview Addendum 12/21/2016 10:25 AM by Malachy Mood, MD    [facility] Prenatal Labs  Dating 8 week [place] Blood type:   B pos  Genetic Screen  NIPS: Normal XY Antibody: neg  Anatomic [place] Complete Rubella:   Immune Varicella: Immune  GTT Third trimester: 124 RPR:   NR  Rhogam N/A HBsAg:   neg  TDaP vaccine                       Flu Shot: HIV:   neg  Baby Food       Breast                         GBS:   Contraception Pill then patch Pap: 08/24/16 NIL HPV negative  CBB     CS/VBAC    Support Person               Antepartum multigravida of advanced maternal age 77/02/2017 by Rod Can, CNM No       Preterm labor symptoms and general obstetric precautions including but not limited to vaginal bleeding, contractions, leaking of fluid and fetal movement were reviewed in detail with the patient. Please refer to After Visit Summary for other counseling recommendations.  TDAP and inlfuenza vaccination today Growth scan in 2  weeks excess weight gian Return in about 2 weeks (around 02/03/2017) for ROB and growth scan.

## 2017-01-20 NOTE — Addendum Note (Signed)
Addended by: [place], Pharrah Rottman B on: 01/20/2017 10:30 AM   Modules accepted: Orders

## 2017-01-20 NOTE — Progress Notes (Signed)
TDAP/FLU/17P/blood form today

## 2017-01-20 NOTE — Progress Notes (Signed)
ROB Pt complaining of lower abdominal pain. Vaginal pressure ctx

## 2017-01-24 ENCOUNTER — Other Ambulatory Visit: Payer: Self-pay | Admitting: Obstetrics and Gynecology

## 2017-01-24 DIAGNOSIS — Z3689 Encounter for other specified antenatal screening: Secondary | ICD-10-CM

## 2017-01-27 ENCOUNTER — Ambulatory Visit (INDEPENDENT_AMBULATORY_CARE_PROVIDER_SITE_OTHER): Payer: 59

## 2017-01-27 ENCOUNTER — Other Ambulatory Visit: Payer: Self-pay | Admitting: Obstetrics and Gynecology

## 2017-01-27 ENCOUNTER — Ambulatory Visit (INDEPENDENT_AMBULATORY_CARE_PROVIDER_SITE_OTHER): Payer: 59 | Admitting: Obstetrics and Gynecology

## 2017-01-27 VITALS — BP 100/60 | Wt 238.0 lb

## 2017-01-27 DIAGNOSIS — O0990 Supervision of high risk pregnancy, unspecified, unspecified trimester: Secondary | ICD-10-CM

## 2017-01-27 DIAGNOSIS — O09219 Supervision of pregnancy with history of pre-term labor, unspecified trimester: Secondary | ICD-10-CM | POA: Diagnosis not present

## 2017-01-27 DIAGNOSIS — Z3A34 34 weeks gestation of pregnancy: Secondary | ICD-10-CM

## 2017-01-27 DIAGNOSIS — O09899 Supervision of other high risk pregnancies, unspecified trimester: Secondary | ICD-10-CM

## 2017-01-27 DIAGNOSIS — Z3689 Encounter for other specified antenatal screening: Secondary | ICD-10-CM

## 2017-01-27 DIAGNOSIS — A084 Viral intestinal infection, unspecified: Secondary | ICD-10-CM

## 2017-01-27 MED ORDER — HYDROXYPROGESTERONE CAPROATE 250 MG/ML IM OIL
250.0000 mg | TOPICAL_OIL | Freq: Once | INTRAMUSCULAR | Status: AC
  Administered 2017-01-27: 250 mg via INTRAMUSCULAR

## 2017-01-27 MED ORDER — ONDANSETRON 4 MG PO TBDP: 4.0000 mg | ORAL_TABLET | Freq: Four times a day (QID) | ORAL | 0 refills | Status: DC | PRN

## 2017-01-27 NOTE — Progress Notes (Signed)
ROB 17P today Nausea/vomiting yesterday all day

## 2017-01-27 NOTE — Progress Notes (Signed)
Routine Prenatal Care Visit  Subjective  [name] is a 36 y.o. [id number] at [redacted]w[redacted]d being seen today for ongoing prenatal care.  She is currently monitored for the following issues for this high-risk pregnancy and has Pregnancy, supervision, high-risk, unspecified trimester; Antepartum multigravida of advanced maternal age; History of preterm delivery, currently pregnant; Preterm labor; and Antepartum cervical shortening on her problem list.  ----------------------------------------------------------------------------------- Patient reports nausea.  Patient has experienced 24-hrs of nausea, symptoms have improved but she has noted a transition to looser stools.  No fevers, no chills.  Denies sick contacts.  Did not eat anything unusual prior to onset of symptoms.  Contractions: Not present. Vag. Bleeding: None.  Movement: Present. Denies leaking of fluid.  ----------------------------------------------------------------------------------- The following portions of the patient's history were reviewed and updated as appropriate: allergies, current medications, past family history, past medical history, past social history, past surgical history and problem list. Problem list updated.   Objective  Blood pressure 100/60, weight 238 lb (108 kg), last menstrual period 06/03/2016. Pregravid weight 195 lb (88.5 kg) Total Weight Gain 43 lb (19.5 kg) Urinalysis: Urine Protein: 1+ Urine Glucose: Negative  Fetal Status: Fetal Heart Rate (bpm): 135 Fundal Height: 35 cm Movement: Present  Presentation: Vertex  General:  Alert, oriented and cooperative. Patient is in no acute distress.  Skin: Skin is warm and dry. No rash noted.   Cardiovascular: Normal heart rate noted  Respiratory: Normal respiratory effort, no problems with respiration noted  Abdomen: Soft, gravid, appropriate for gestational age.       Pelvic:  Cervical exam deferred        Extremities: Normal range of motion.     ental Status:  Normal mood and affect. Normal behavior. Normal judgment and thought content.     Assessment   36 y.o. [id number] at [redacted]w[redacted]d by  03/04/2017, by Ultrasound presenting for routine prenatal visit  Plan   pregnancy 5 Problems (from 06/03/16 to present)    Problem Noted Resolved   History of preterm delivery, currently pregnant 08/24/2016 by Malachy Mood, MD No   Overview Signed 11/29/2016  4:01 PM by Will Bonnet, MD    - shortened cervix at 26 weeks (1.9 cm), +fFN - s/p BMTZ course on 8/14 and 8/15      Pregnancy, supervision, high-risk, unspecified trimester 07/27/2016 by Rod Can, CNM No   Overview Addendum 01/20/2017 10:05 AM by Malachy Mood, MD    [facility] Prenatal Labs  Dating 8 week [place] Blood type:   B pos  Genetic Screen  NIPS: Normal XY Antibody: neg  Anatomic [place] Complete Rubella:   Immune Varicella: Immune  GTT Third trimester: 124 RPR:   NR  Rhogam N/A HBsAg:   neg  TDaP vaccine 01/20/17      Flu Shot:01/20/17 HIV:   neg  Baby Food Breast         GBS:   Contraception Pill then patch Pap: 08/24/16 NIL HPV negative  CBB     CS/VBAC    Support Person               Antepartum multigravida of advanced maternal age 48/02/2017 by Rod Can, CNM No       Term labor symptoms and general obstetric precautions including but not limited to vaginal bleeding, contractions, leaking of fluid and fetal movement were reviewed in detail with the patient. Please refer to After Visit Summary for other counseling recommendations.  - growth scan today noted wrong EDD of  02/23/2016, actually EDD is [redacted]w[redacted]d with is actually exactly the dates obtained by scan today showing a fetus with and EFW of 5lbs 5oz.   - Zofran rx for viral gastroenteritis.   - GBS next visit Return in about 8 days (around 02/04/2017) for [place] .

## 2017-01-30 ENCOUNTER — Encounter: Payer: Self-pay | Admitting: Obstetrics and Gynecology

## 2017-01-30 ENCOUNTER — Encounter: Payer: Self-pay | Admitting: Certified Nurse Midwife

## 2017-01-30 ENCOUNTER — Observation Stay
Admission: EM | Admit: 2017-01-30 | Discharge: 2017-01-31 | Disposition: A | Discharge status: Home / Self Care | Payer: 59 | Attending: Certified Nurse Midwife | Admitting: Certified Nurse Midwife

## 2017-01-30 DIAGNOSIS — E860 Dehydration: Principal | ICD-10-CM | POA: Insufficient documentation

## 2017-01-30 DIAGNOSIS — O9989 Other specified diseases and conditions complicating pregnancy, childbirth and the puerperium: Secondary | ICD-10-CM | POA: Diagnosis not present

## 2017-01-30 DIAGNOSIS — O99213 Obesity complicating pregnancy, third trimester: Secondary | ICD-10-CM | POA: Diagnosis not present

## 2017-01-30 DIAGNOSIS — E876 Hypokalemia: Secondary | ICD-10-CM | POA: Insufficient documentation

## 2017-01-30 DIAGNOSIS — O219 Vomiting of pregnancy, unspecified: Secondary | ICD-10-CM | POA: Diagnosis present

## 2017-01-30 DIAGNOSIS — Z79899 Other long term (current) drug therapy: Secondary | ICD-10-CM | POA: Diagnosis not present

## 2017-01-30 DIAGNOSIS — O99283 Endocrine, nutritional and metabolic diseases complicating pregnancy, third trimester: Secondary | ICD-10-CM | POA: Insufficient documentation

## 2017-01-30 DIAGNOSIS — O212 Late vomiting of pregnancy: Secondary | ICD-10-CM | POA: Diagnosis not present

## 2017-01-30 DIAGNOSIS — Z3A35 35 weeks gestation of pregnancy: Secondary | ICD-10-CM | POA: Insufficient documentation

## 2017-01-30 HISTORY — DX: Obesity, unspecified: E66.9

## 2017-01-30 HISTORY — DX: Burn of unspecified body region, unspecified degree: T30.0

## 2017-01-30 LAB — COMPREHENSIVE METABOLIC PANEL
ALBUMIN: 2.6 g/dL — AB (ref 3.5–5.0)
ALK PHOS: 143 U/L — AB (ref 38–126)
ALT: 24 U/L (ref 14–54)
AST: 25 U/L (ref 15–41)
Anion gap: 10 (ref 5–15)
BILIRUBIN TOTAL: 0.6 mg/dL (ref 0.3–1.2)
BUN: <5 mg/dL — ABNORMAL LOW (ref 6–20)
CALCIUM: 8.6 mg/dL — AB (ref 8.9–10.3)
CO2: 22 mmol/L (ref 22–32)
Chloride: 106 mmol/L (ref 101–111)
Creatinine, Ser: 0.5 mg/dL (ref 0.44–1.00)
GFR calc Af Amer: >60 mL/min (ref >60)
GFR calc non Af Amer: >60 mL/min (ref >60)
GLUCOSE: 74 mg/dL (ref 65–99)
Potassium: 2.8 mmol/L — ABNORMAL LOW (ref 3.5–5.1)
SODIUM: 138 mmol/L (ref 135–145)
TOTAL PROTEIN: 5.9 g/dL — AB (ref 6.5–8.1)

## 2017-01-30 LAB — URINALYSIS, COMPLETE (UACMP) WITH MICROSCOPIC
BACTERIA UA: NONE SEEN
BILIRUBIN URINE: NEGATIVE
Glucose, UA: NEGATIVE mg/dL
Hgb urine dipstick: NEGATIVE
Ketones, ur: 80 mg/dL — AB
LEUKOCYTES UA: NEGATIVE
Nitrite: NEGATIVE
PROTEIN: 30 mg/dL — AB
SPECIFIC GRAVITY, URINE: 1.021 (ref 1.005–1.030)
pH: 6 (ref 5.0–8.0)

## 2017-01-30 LAB — CBC WITH DIFFERENTIAL/PLATELET
BASOS ABS: 0 10*3/uL (ref 0–0.1)
BASOS PCT: 1 %
Eosinophils Absolute: 0.2 10*3/uL (ref 0–0.7)
Eosinophils Relative: 2 %
HEMATOCRIT: 35.2 % (ref 35.0–47.0)
HEMOGLOBIN: 11.8 g/dL — AB (ref 12.0–16.0)
Lymphocytes Relative: 21 %
Lymphs Abs: 1.6 10*3/uL (ref 1.0–3.6)
MCH: 29.8 pg (ref 26.0–34.0)
MCHC: 33.5 g/dL (ref 32.0–36.0)
MCV: 89.1 fL (ref 80.0–100.0)
Monocytes Absolute: 0.6 10*3/uL (ref 0.2–0.9)
Monocytes Relative: 8 %
NEUTROS ABS: 5.3 10*3/uL (ref 1.4–6.5)
NEUTROS PCT: 68 %
Platelets: 250 10*3/uL (ref 150–440)
RBC: 3.95 MIL/uL (ref 3.80–5.20)
RDW: 14.2 % (ref 11.5–14.5)
WBC: 7.6 10*3/uL (ref 3.6–11.0)

## 2017-01-30 LAB — MAGNESIUM: Magnesium: 1.5 mg/dL — ABNORMAL LOW (ref 1.7–2.4)

## 2017-01-30 MED ORDER — LACTATED RINGERS IV SOLN
INTRAVENOUS | Status: DC
  Administered 2017-01-30 – 2017-01-31 (×3): via INTRAVENOUS

## 2017-01-30 MED ORDER — ONDANSETRON HCL 4 MG/2ML IJ SOLN
4.0000 mg | INTRAMUSCULAR | Status: DC | PRN
  Administered 2017-01-30 – 2017-01-31 (×2): 4 mg via INTRAVENOUS
  Filled 2017-01-30: qty 2

## 2017-01-30 MED ORDER — LACTATED RINGERS IV BOLUS (SEPSIS)
500.0000 mL | Freq: Once | INTRAVENOUS | Status: AC
  Administered 2017-01-30: 500 mL via INTRAVENOUS

## 2017-01-30 MED ORDER — KCL-LACTATED RINGERS-D5W 20 MEQ/L IV SOLN
INTRAVENOUS | Status: DC
  Administered 2017-01-31: 01:00:00 via INTRAVENOUS
  Filled 2017-01-30 (×5): qty 1000

## 2017-01-30 MED ORDER — FAMOTIDINE IN NACL 20-0.9 MG/50ML-% IV SOLN
20.0000 mg | Freq: Two times a day (BID) | INTRAVENOUS | Status: DC
  Administered 2017-01-31: 20 mg via INTRAVENOUS
  Filled 2017-01-30: qty 50

## 2017-01-30 MED ORDER — ONDANSETRON HCL 4 MG/2ML IJ SOLN
INTRAMUSCULAR | Status: AC
  Administered 2017-01-31: 4 mg via INTRAVENOUS
  Filled 2017-01-30: qty 2

## 2017-01-30 MED ORDER — MAGNESIUM SULFATE 2 GM/50ML IV SOLN
2.0000 g | Freq: Once | INTRAVENOUS | Status: AC
  Administered 2017-01-30: 2 g via INTRAVENOUS
  Filled 2017-01-30: qty 50
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
## 2017-01-31 DIAGNOSIS — E860 Dehydration: Secondary | ICD-10-CM | POA: Diagnosis not present

## 2017-01-31 DIAGNOSIS — Z3A35 35 weeks gestation of pregnancy: Secondary | ICD-10-CM | POA: Diagnosis not present

## 2017-01-31 DIAGNOSIS — O212 Late vomiting of pregnancy: Secondary | ICD-10-CM | POA: Diagnosis not present

## 2017-01-31 LAB — POTASSIUM: POTASSIUM: 3.2 mmol/L — AB (ref 3.5–5.1)

## 2017-01-31 LAB — MAGNESIUM: MAGNESIUM: 1.7 mg/dL (ref 1.7–2.4)

## 2017-01-31 MED ORDER — POTASSIUM CHLORIDE CRYS ER 20 MEQ PO TBCR: 20.0000 meq | EXTENDED_RELEASE_TABLET | Freq: Two times a day (BID) | ORAL | 0 refills | Status: DC

## 2017-01-31 MED ORDER — SODIUM CHLORIDE FLUSH 0.9 % IV SOLN
INTRAVENOUS | Status: AC
  Filled 2017-01-31: qty 10

## 2017-01-31 MED ORDER — POTASSIUM CHLORIDE CRYS ER 20 MEQ PO TBCR: 20.0000 meq | EXTENDED_RELEASE_TABLET | Freq: Two times a day (BID) | ORAL | Status: DC

## 2017-01-31 MED ORDER — CALCIUM CARBONATE ANTACID 500 MG PO CHEW: 2.0000 | CHEWABLE_TABLET | ORAL | Status: DC | PRN

## 2017-01-31 NOTE — Progress Notes (Signed)
Dc inst reviewed with pt.  Rx given for home use. Ambulatory to car
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
## 2017-01-31 NOTE — Progress Notes (Signed)
Pt continuing to vomit, unable to keep POs down.

## 2017-01-31 NOTE — Telephone Encounter (Signed)
Patient currently in Labor and Delivery under observation.

## 2017-01-31 NOTE — Progress Notes (Signed)
Pt snacking on applesauce and crackers, per JG, CNM pt may order regular tray to attempt further POs, if tolerated, may d/c home

## 2017-02-03 ENCOUNTER — Ambulatory Visit (INDEPENDENT_AMBULATORY_CARE_PROVIDER_SITE_OTHER): Payer: 59 | Admitting: Obstetrics and Gynecology

## 2017-02-03 VITALS — BP 130/62 | Wt 247.0 lb

## 2017-02-03 DIAGNOSIS — O09219 Supervision of pregnancy with history of pre-term labor, unspecified trimester: Secondary | ICD-10-CM

## 2017-02-03 DIAGNOSIS — Z3685 Encounter for antenatal screening for Streptococcus B: Secondary | ICD-10-CM

## 2017-02-03 DIAGNOSIS — Z3A35 35 weeks gestation of pregnancy: Secondary | ICD-10-CM

## 2017-02-03 DIAGNOSIS — O26879 Cervical shortening, unspecified trimester: Secondary | ICD-10-CM

## 2017-02-03 DIAGNOSIS — O09899 Supervision of other high risk pregnancies, unspecified trimester: Secondary | ICD-10-CM

## 2017-02-03 DIAGNOSIS — O0990 Supervision of high risk pregnancy, unspecified, unspecified trimester: Secondary | ICD-10-CM

## 2017-02-03 MED ORDER — HYDROXYPROGESTERONE CAPROATE 250 MG/ML IM OIL
250.0000 mg | TOPICAL_OIL | Freq: Once | INTRAMUSCULAR | Status: AC
  Administered 2017-02-03: 250 mg via INTRAMUSCULAR

## 2017-02-03 MED ORDER — HYDROXYPROGESTERONE CAPROATE 275 MG/1.1ML ~~LOC~~ SOAJ: 275.0000 mg | Freq: Once | SUBCUTANEOUS | Status: DC

## 2017-02-03 MED ORDER — HYDROXYPROGESTERONE CAPROATE 250 MG/ML IM OIL: 250.0000 mg | TOPICAL_OIL | Freq: Once | INTRAMUSCULAR | Status: DC

## 2017-02-03 NOTE — Progress Notes (Signed)
    Routine Prenatal Care Visit  Subjective  [name] is a 36 y.o. H4T6546 at [redacted]w[redacted]d being seen today for ongoing prenatal care.  She is currently monitored for the following issues for this high-risk pregnancy and has Pregnancy, supervision, high-risk, unspecified trimester; Antepartum multigravida of advanced maternal age; History of preterm delivery, currently pregnant; Preterm labor; Antepartum cervical shortening; and Nausea and vomiting during pregnancy on her problem list.  ----------------------------------------------------------------------------------- Patient reports nausea improved after recent L&D visit.   Contractions: Not present. Vag. Bleeding: None.  Movement: Present. Denies leaking of fluid.  ----------------------------------------------------------------------------------- The following portions of the patient's history were reviewed and updated as appropriate: allergies, current medications, past family history, past medical history, past social history, past surgical history and problem list. Problem list updated.   Objective  Blood pressure 130/62, weight 247 lb (112 kg), last menstrual period 06/03/2016. Pregravid weight 195 lb (88.5 kg) Total Weight Gain 52 lb (23.6 kg) Urinalysis: Urine Protein: Trace Urine Glucose: Negative  Fetal Status: Fetal Heart Rate (bpm): 130 Fundal Height: 36 cm Movement: Present  Presentation: Vertex  General:  Alert, oriented and cooperative. Patient is in no acute distress.  Skin: Skin is warm and dry. No rash noted.   Cardiovascular: Normal heart rate noted  Respiratory: Normal respiratory effort, no problems with respiration noted  Abdomen: Soft, gravid, appropriate for gestational age. Pain/Pressure: Present     Pelvic:  Cervical exam performed Dilation: 1 Effacement (%): 50 Station: -3  Extremities: Normal range of motion.     ental Status: Normal mood and affect. Normal behavior. Normal judgment and thought content.      Assessment   36 y.o. T0P5465 at [redacted]w[redacted]d by  03/04/2017, by Ultrasound presenting for routine prenatal visit  Plan   pregnancy 5 Problems (from 06/03/16 to present)    Problem Noted Resolved   History of preterm delivery, currently pregnant 08/24/2016 by Malachy Mood, MD No   Overview Signed 11/29/2016  4:01 PM by Will Bonnet, MD    - shortened cervix at 26 weeks (1.9 cm), +fFN - s/p BMTZ course on 8/14 and 8/15      Pregnancy, supervision, high-risk, unspecified trimester 07/27/2016 by Rod Can, CNM No   Overview Addendum 01/20/2017 10:05 AM by Malachy Mood, MD    [facility] Prenatal Labs  Dating 8 week [place] Blood type:   B pos  Genetic Screen  NIPS: Normal XY Antibody: neg  Anatomic [place] Complete Rubella:   Immune Varicella: Immune  GTT Third trimester: 124 RPR:   NR  Rhogam N/A HBsAg:   neg  TDaP vaccine 01/20/17      Flu Shot:01/20/17 HIV:   neg  Baby Food Breast         GBS:   Contraception Pill then patch Pap: 08/24/16 NIL HPV negative  CBB     CS/VBAC    Support Person               Antepartum multigravida of advanced maternal age 87/02/2017 by Rod Can, CNM No       Term labor symptoms and general obstetric precautions including but not limited to vaginal bleeding, contractions, leaking of fluid and fetal movement were reviewed in detail with the patient. Please refer to After Visit Summary for other counseling recommendations.   Return in about 1 week (around 02/10/2017) for ROB.

## 2017-02-03 NOTE — Progress Notes (Signed)
ROB 17P L&D on Monday 10/15

## 2017-02-05 LAB — STREP GP B NAA: Strep Gp B NAA: NEGATIVE

## 2017-02-07 ENCOUNTER — Telehealth: Payer: Self-pay

## 2017-02-07 NOTE — Telephone Encounter (Signed)
FMLA/DISABILITY form filled out for [employer] for [employer], FOB, and given to [place] for processing.

## 2017-02-10 ENCOUNTER — Ambulatory Visit (INDEPENDENT_AMBULATORY_CARE_PROVIDER_SITE_OTHER): Payer: 59 | Admitting: Obstetrics and Gynecology

## 2017-02-10 VITALS — BP 110/70 | Wt 238.0 lb

## 2017-02-10 DIAGNOSIS — O09219 Supervision of pregnancy with history of pre-term labor, unspecified trimester: Secondary | ICD-10-CM

## 2017-02-10 DIAGNOSIS — O0990 Supervision of high risk pregnancy, unspecified, unspecified trimester: Secondary | ICD-10-CM

## 2017-02-10 DIAGNOSIS — O26879 Cervical shortening, unspecified trimester: Secondary | ICD-10-CM

## 2017-02-10 DIAGNOSIS — Z3A36 36 weeks gestation of pregnancy: Secondary | ICD-10-CM

## 2017-02-10 DIAGNOSIS — O09529 Supervision of elderly multigravida, unspecified trimester: Secondary | ICD-10-CM

## 2017-02-10 DIAGNOSIS — O09899 Supervision of other high risk pregnancies, unspecified trimester: Secondary | ICD-10-CM

## 2017-02-10 NOTE — Progress Notes (Signed)
    Routine Prenatal Care Visit  Subjective  [name] is a 36 y.o. X5T7001 at [redacted]w[redacted]d being seen today for ongoing prenatal care.  She is currently monitored for the following issues for this high-risk pregnancy and has Pregnancy, supervision, high-risk, unspecified trimester; Antepartum multigravida of advanced maternal age; History of preterm delivery, currently pregnant; Preterm labor; Antepartum cervical shortening; and Nausea and vomiting during pregnancy on her problem list.  ----------------------------------------------------------------------------------- Patient reports no complaints.   Contractions: Irregular. Vag. Bleeding: None.  Movement: Present. Denies leaking of fluid.  ----------------------------------------------------------------------------------- The following portions of the patient's history were reviewed and updated as appropriate: allergies, current medications, past family history, past medical history, past social history, past surgical history and problem list. Problem list updated.   Objective  Blood pressure 110/70, weight 238 lb (108 kg), last menstrual period 06/03/2016. Pregravid weight 195 lb (88.5 kg) Total Weight Gain 43 lb (19.5 kg) Urinalysis:      Fetal Status: Fetal Heart Rate (bpm): 145 Fundal Height: 37 cm Movement: Present  Presentation: Vertex  General:  Alert, oriented and cooperative. Patient is in no acute distress.  Skin: Skin is warm and dry. No rash noted.   Cardiovascular: Normal heart rate noted  Respiratory: Normal respiratory effort, no problems with respiration noted  Abdomen: Soft, gravid, appropriate for gestational age. Pain/Pressure: Present     Pelvic:  Cervical exam performed Dilation: 1.5 Effacement (%): 50 Station: -3  Extremities: Normal range of motion.     ental Status: Normal mood and affect. Normal behavior. Normal judgment and thought content.     Assessment   36 y.o. [id number] at [redacted]w[redacted]d by  03/04/2017, by  Ultrasound presenting for routine prenatal visit  Plan   pregnancy 5 Problems (from 06/03/16 to present)    Problem Noted Resolved   History of preterm delivery, currently pregnant 08/24/2016 by Malachy Mood, MD No   Overview Signed 11/29/2016  4:01 PM by Will Bonnet, MD    - shortened cervix at 26 weeks (1.9 cm), +fFN - s/p BMTZ course on 8/14 and 8/15      Pregnancy, supervision, high-risk, unspecified trimester 07/27/2016 by Rod Can, CNM No   Overview Addendum 02/06/2017  9:26 AM by Malachy Mood, MD    [facility] Prenatal Labs  Dating 8 week [place] Blood type:   B pos  Genetic Screen  NIPS: Normal XY Antibody: neg  Anatomic [place] Complete Rubella:   Immune Varicella: Immune  GTT Third trimester: 124 RPR:   NR  Rhogam N/A HBsAg:   neg  TDaP vaccine 01/20/17      Flu Shot:01/20/17 HIV:   neg  Baby Food Breast         GBS: negative  Contraception Pill then patch Pap: 08/24/16 NIL HPV negative  CBB     CS/VBAC    Support Person               Antepartum multigravida of advanced maternal age 80/02/2017 by Rod Can, CNM No       Term labor symptoms and general obstetric precautions including but not limited to vaginal bleeding, contractions, leaking of fluid and fetal movement were reviewed in detail with the patient. Please refer to After Visit Summary for other counseling recommendations.   Return in about 1 week (around 02/17/2017) for ROB.

## 2017-02-10 NOTE — Progress Notes (Signed)
ROB

## 2017-02-16 ENCOUNTER — Encounter: Payer: Self-pay | Admitting: Obstetrics and Gynecology

## 2017-02-17 ENCOUNTER — Ambulatory Visit (INDEPENDENT_AMBULATORY_CARE_PROVIDER_SITE_OTHER): Payer: 59 | Admitting: Certified Nurse Midwife

## 2017-02-17 VITALS — BP 102/62 | Wt 240.0 lb

## 2017-02-17 DIAGNOSIS — O09529 Supervision of elderly multigravida, unspecified trimester: Secondary | ICD-10-CM

## 2017-02-17 DIAGNOSIS — B9689 Other specified bacterial agents as the cause of diseases classified elsewhere: Secondary | ICD-10-CM

## 2017-02-17 DIAGNOSIS — Z3A37 37 weeks gestation of pregnancy: Secondary | ICD-10-CM

## 2017-02-17 DIAGNOSIS — N760 Acute vaginitis: Secondary | ICD-10-CM

## 2017-02-17 DIAGNOSIS — N898 Other specified noninflammatory disorders of vagina: Secondary | ICD-10-CM

## 2017-02-17 MED ORDER — METRONIDAZOLE 500 MG PO TABS: 500.0000 mg | ORAL_TABLET | Freq: Two times a day (BID) | ORAL | 0 refills | Status: AC

## 2017-02-17 NOTE — Progress Notes (Signed)
Pt c/o intermittent ctx. Pt also noticing more fluid and discharge. Desires cervical check.

## 2017-02-17 NOTE — Progress Notes (Signed)
ROB at [facility]. Pregnancy remarkable for AMA and history of PTD x2; has completed a course of 17P; GBS negative Complains of increased discharge and strong regular contractions last night for<1 hour. Able to eventually fall asleep. Nitrazine was negative. Wet prep positive for clue cells, negative for hyphae and Trich RX with Flagyl 500 mgm BID x 7 days. Discussed how to take with food. And ballotable Cervix: 3/60%/-3 Labor precautions ROB 1 week Dalia Heading, CNM

## 2017-02-23 ENCOUNTER — Ambulatory Visit (INDEPENDENT_AMBULATORY_CARE_PROVIDER_SITE_OTHER): Payer: 59 | Admitting: Certified Nurse Midwife

## 2017-02-23 ENCOUNTER — Encounter: Payer: 59 | Admitting: Certified Nurse Midwife

## 2017-02-23 VITALS — BP 110/76 | Wt 239.0 lb

## 2017-02-23 DIAGNOSIS — O09529 Supervision of elderly multigravida, unspecified trimester: Secondary | ICD-10-CM

## 2017-02-23 DIAGNOSIS — Z3A38 38 weeks gestation of pregnancy: Secondary | ICD-10-CM

## 2017-02-23 DIAGNOSIS — O0990 Supervision of high risk pregnancy, unspecified, unspecified trimester: Secondary | ICD-10-CM

## 2017-02-23 NOTE — Progress Notes (Signed)
ROB  Pt has vaginal pressure, braxton hicks with back pain.  Pt desires cervix check

## 2017-02-23 NOTE — Progress Notes (Signed)
ROB at 38wk5day. Pregnancy remarkable for AMA and history of PTD x2; has completed a course of 17P; GBS negative Complains of mucoid discharge and strong regular contractions at night for<1 hour x 3 nights Cervix: 4.5cm/75%/-1 Labor precautions ROB in 1 week Dalia Heading, CNM

## 2017-02-26 ENCOUNTER — Inpatient Hospital Stay
Admission: EM | Admit: 2017-02-26 | Discharge: 2017-02-28 | DRG: 806 | Disposition: A | Discharge status: Home / Self Care | Payer: 59 | Attending: Certified Nurse Midwife | Admitting: Certified Nurse Midwife

## 2017-02-26 ENCOUNTER — Other Ambulatory Visit: Payer: Self-pay

## 2017-02-26 DIAGNOSIS — O09899 Supervision of other high risk pregnancies, unspecified trimester: Secondary | ICD-10-CM

## 2017-02-26 DIAGNOSIS — O09529 Supervision of elderly multigravida, unspecified trimester: Secondary | ICD-10-CM

## 2017-02-26 DIAGNOSIS — D62 Acute posthemorrhagic anemia: Secondary | ICD-10-CM | POA: Diagnosis not present

## 2017-02-26 DIAGNOSIS — O0990 Supervision of high risk pregnancy, unspecified, unspecified trimester: Secondary | ICD-10-CM

## 2017-02-26 DIAGNOSIS — O9081 Anemia of the puerperium: Secondary | ICD-10-CM | POA: Diagnosis not present

## 2017-02-26 DIAGNOSIS — Z3483 Encounter for supervision of other normal pregnancy, third trimester: Secondary | ICD-10-CM | POA: Diagnosis present

## 2017-02-26 DIAGNOSIS — O09219 Supervision of pregnancy with history of pre-term labor, unspecified trimester: Secondary | ICD-10-CM

## 2017-02-26 DIAGNOSIS — Z3A39 39 weeks gestation of pregnancy: Secondary | ICD-10-CM

## 2017-02-26 LAB — CBC
HCT: 38.8 % (ref 35.0–47.0)
Hemoglobin: 13.3 g/dL (ref 12.0–16.0)
MCH: 30.8 pg (ref 26.0–34.0)
MCHC: 34.2 g/dL (ref 32.0–36.0)
MCV: 90 fL (ref 80.0–100.0)
PLATELETS: 237 10*3/uL (ref 150–440)
RBC: 4.31 MIL/uL (ref 3.80–5.20)
RDW: 15.6 % — AB (ref 11.5–14.5)
WBC: 7.2 10*3/uL (ref 3.6–11.0)

## 2017-02-26 MED ORDER — AMMONIA AROMATIC IN INHA
RESPIRATORY_TRACT | Status: AC
  Filled 2017-02-26: qty 10

## 2017-02-26 MED ORDER — OXYTOCIN 10 UNIT/ML IJ SOLN
INTRAMUSCULAR | Status: AC
  Filled 2017-02-26: qty 2

## 2017-02-26 MED ORDER — OXYTOCIN 10 UNIT/ML IJ SOLN
INTRAMUSCULAR | Status: DC
Start: 2017-02-26 — End: 2017-02-26
  Filled 2017-02-26: qty 2

## 2017-02-26 MED ORDER — AMMONIA AROMATIC IN INHA: 0.3000 mL | Freq: Once | RESPIRATORY_TRACT | Status: DC | PRN

## 2017-02-26 MED ORDER — LIDOCAINE HCL (PF) 1 % IJ SOLN
INTRAMUSCULAR | Status: AC
  Filled 2017-02-26: qty 30

## 2017-02-26 MED ORDER — BUTORPHANOL TARTRATE 2 MG/ML IJ SOLN
2.0000 mg | INTRAMUSCULAR | Status: DC | PRN
  Administered 2017-02-26: 2 mg via INTRAVENOUS
  Filled 2017-02-26: qty 1

## 2017-02-26 MED ORDER — LACTATED RINGERS IV SOLN: 500.0000 mL | INTRAVENOUS | Status: DC | PRN

## 2017-02-26 MED ORDER — OXYTOCIN 40 UNITS IN LACTATED RINGERS INFUSION - SIMPLE MED
INTRAVENOUS | Status: AC
  Filled 2017-02-26: qty 1000

## 2017-02-26 MED ORDER — MISOPROSTOL 200 MCG PO TABS: 800.0000 ug | ORAL_TABLET | ORAL | Status: DC

## 2017-02-26 MED ORDER — PRENATAL MULTIVITAMIN CH
1.0000 | ORAL_TABLET | Freq: Every day | ORAL | Status: DC
  Administered 2017-02-27: 1 via ORAL
  Filled 2017-02-26: qty 1

## 2017-02-26 MED ORDER — ONDANSETRON HCL 4 MG/2ML IJ SOLN: 4.0000 mg | Freq: Four times a day (QID) | INTRAMUSCULAR | Status: DC | PRN

## 2017-02-26 MED ORDER — SENNOSIDES-DOCUSATE SODIUM 8.6-50 MG PO TABS
2.0000 | ORAL_TABLET | ORAL | Status: DC
  Administered 2017-02-28: 2 via ORAL
  Filled 2017-02-26: qty 2

## 2017-02-26 MED ORDER — IBUPROFEN 600 MG PO TABS
600.0000 mg | ORAL_TABLET | Freq: Four times a day (QID) | ORAL | Status: DC
  Administered 2017-02-26 – 2017-02-28 (×7): 600 mg via ORAL
  Filled 2017-02-26 (×7): qty 1

## 2017-02-26 MED ORDER — MISOPROSTOL 200 MCG PO TABS
ORAL_TABLET | ORAL | Status: AC
  Filled 2017-02-26: qty 4

## 2017-02-26 MED ORDER — WITCH HAZEL-GLYCERIN EX PADS: 1.0000 "application " | MEDICATED_PAD | CUTANEOUS | Status: DC | PRN

## 2017-02-26 MED ORDER — OXYCODONE HCL 5 MG PO TABS: 5.0000 mg | ORAL_TABLET | ORAL | Status: DC | PRN

## 2017-02-26 MED ORDER — DIBUCAINE 1 % RE OINT: 1.0000 "application " | TOPICAL_OINTMENT | RECTAL | Status: DC | PRN

## 2017-02-26 MED ORDER — LACTATED RINGERS IV SOLN
INTRAVENOUS | Status: DC
  Administered 2017-02-26: 05:00:00 via INTRAVENOUS

## 2017-02-26 MED ORDER — ONDANSETRON HCL 4 MG/2ML IJ SOLN: 4.0000 mg | INTRAMUSCULAR | Status: DC | PRN

## 2017-02-26 MED ORDER — OXYTOCIN 40 UNITS IN LACTATED RINGERS INFUSION - SIMPLE MED: 2.5000 [IU]/h | INTRAVENOUS | Status: DC

## 2017-02-26 MED ORDER — FERROUS SULFATE 325 (65 FE) MG PO TABS
325.0000 mg | ORAL_TABLET | Freq: Every day | ORAL | Status: DC
  Administered 2017-02-27 – 2017-02-28 (×2): 325 mg via ORAL
  Filled 2017-02-26 (×2): qty 1

## 2017-02-26 MED ORDER — OXYTOCIN BOLUS FROM INFUSION
500.0000 mL | Freq: Once | INTRAVENOUS | Status: AC
  Administered 2017-02-26: 500 mL via INTRAVENOUS
  Administered 2017-02-26: 50 mL via INTRAVENOUS

## 2017-02-26 MED ORDER — COCONUT OIL OIL
1.0000 "application " | TOPICAL_OIL | Status: DC | PRN
  Administered 2017-02-26: 1 via TOPICAL
  Filled 2017-02-26: qty 120

## 2017-02-26 MED ORDER — LIDOCAINE HCL (PF) 1 % IJ SOLN
30.0000 mL | INTRAMUSCULAR | Status: AC | PRN
  Administered 2017-02-26: 30 mL via SUBCUTANEOUS

## 2017-02-26 MED ORDER — LIDOCAINE HCL (PF) 1 % IJ SOLN
INTRAMUSCULAR | Status: AC
  Administered 2017-02-26: 30 mL via SUBCUTANEOUS
  Filled 2017-02-26: qty 30

## 2017-02-26 MED ORDER — ONDANSETRON HCL 4 MG PO TABS: 4.0000 mg | ORAL_TABLET | ORAL | Status: DC | PRN

## 2017-02-26 MED ORDER — SIMETHICONE 80 MG PO CHEW: 80.0000 mg | CHEWABLE_TABLET | ORAL | Status: DC | PRN

## 2017-02-26 MED ORDER — BENZOCAINE-MENTHOL 20-0.5 % EX AERO
1.0000 "application " | INHALATION_SPRAY | CUTANEOUS | Status: DC | PRN
  Filled 2017-02-26: qty 56

## 2017-02-26 NOTE — Discharge Summary (Signed)
Physician Obstetric Discharge Summary  Patient ID: [name] MRN: [id number] DOB/AGE: 35-Mar-1982 35 y.o.   Date of Admission: 02/26/2017  Date of Discharge: 02/28/2017  Admitting Diagnosis: Onset of Labor at [redacted]w[redacted]d  Secondary Diagnosis: Advanced maternal age  Mode of Delivery: normal spontaneous vaginal delivery 02/26/2017      Discharge Diagnosis: Term intrauterine pregnancy-delivered at 39wk1d   Intrapartum Procedures: Atificial rupture of membranes and repair of obstetric lacerations   Post partum procedures: none  Complications: meconium stained amniotic fluid/ body cord x 1, true knot in cord   Jean Lafitte is a [id number] who had a SVD on 02/26/2017;  for further details of this delivery, please refer to the delivery note.  Patient had an uncomplicated postpartum course.  By time of discharge on PPD#2, her pain was controlled on oral pain medications; she had appropriate lochia and was ambulating, voiding without difficulty and tolerating regular diet.  She was deemed stable for discharge to home.       Labs: CBC Latest Ref Rng & Units 02/27/2017 02/26/2017 01/30/2017  WBC 3.6 - 11.0 K/uL 11.3(H) 7.2 7.6  Hemoglobin 12.0 - 16.0 g/dL 11.2(L) 13.3 11.8(L)  Hematocrit 35.0 - 47.0 % 34.0(L) 38.8 35.2  Platelets 150 - 440 K/uL 215 237 250   B pos/ RI/ VI Physical exam:  Blood pressure 128/64, pulse (!) 102, temperature 98.5 F (36.9 C), temperature source Oral, resp. rate 14, height 5\' 7"  (1.702 m), weight 108.4 kg (239 lb), last menstrual period 06/03/2016, currently breastfeeding. General: alert and no distress Lochia: appropriate Abdomen: soft, NT Uterine Fundus: firm Incision: NA Extremities: No evidence of DVT seen on physical exam. No lower extremity edema.  Discharge Instructions: Per After Visit Summary. Activity: Advance as tolerated. Pelvic rest for 6 weeks.  Also refer to Discharge Instructions Diet: Regular Medications: Allergies  as of 02/28/2017   No Known Allergies     Medication List    TAKE these medications   norethindrone 0.35 MG tablet Commonly known as:  MICRONOR,CAMILA,ERRIN Take 1 tablet (0.35 mg total) daily by mouth. Start taking on:  03/12/2017   PNV PRENATAL PLUS MULTIVITAMIN 27-1 MG Tabs Take 1 tablet by mouth daily.      Outpatient follow up:  Follow-up Information    Dalia Heading, CNM. Schedule an appointment as soon as possible for a visit.   Specialty:  Certified Nurse Midwife Why:  for 6 week postpartum check  Contact information: [place] [address] [phone]          Postpartum contraception: start on progesterone-only pill- may want to have LARC. Discuss at 6 wk visit  Discharged Condition: good  Discharged to: home   Newborn Data: [place] Disposition:home with mother  Apgars: APGAR (1 MIN): 8   APGAR (5 MINS): 8   APGAR (10 MINS):    Baby Feeding: Breast/Formula  Rod Can, CNM 02/28/2017 9:25 AM

## 2017-02-26 NOTE — Discharge Instructions (Signed)
°Vaginal Delivery, Care After °Refer to this sheet in the next few weeks. These discharge instructions provide you with information on caring for yourself after delivery. Your caregiver may also give you specific instructions. Your treatment has been planned according to the most current medical practices available, but problems sometimes occur. Call your caregiver if you have any problems or questions after you go home. °HOME CARE INSTRUCTIONS °1. Take over-the-counter or prescription medicines only as directed by your caregiver or pharmacist. °2. Do not drink alcohol, especially if you are breastfeeding or taking medicine to relieve pain. °3. Do not smoke tobacco. °4. Continue to use good perineal care. Good perineal care includes: °1. Wiping your perineum from back to front °2. Keeping your perineum clean. °3. You can do sitz baths twice a day, to help keep this area clean °5. Do not use tampons, douche or have sex for 6 weeks °6. Shower only and avoid sitting in submerged water, aside from sitz baths °7. Wear a well-fitting bra that provides breast support. °8. Eat healthy foods. °9. Drink enough fluids to keep your urine clear or pale yellow. °10. Eat high-fiber foods such as whole grain cereals and breads, brown rice, beans, and fresh fruits and vegetables every day. These foods may help prevent or relieve constipation. °11. Avoid constipation with high fiber foods or medications, such as miralax or metamucil °12. Follow your caregiver's recommendations regarding resumption of activities such as climbing stairs, driving, lifting, exercising, or traveling. °13. Talk to your caregiver about resuming sexual activities. Resumption of sexual activities after 6 weeks is dependent upon your risk of infection, your rate of healing, and your comfort and desire to resume sexual activity. °14. Try to have someone help you with your household activities and your newborn for at least a few days after you leave the  hospital. °15. Rest as much as possible. Try to rest or take a nap when your newborn is sleeping. °16. Increase your activities gradually. °17. Keep all of your scheduled postpartum appointments. It is very important to keep your scheduled follow-up appointments. At these appointments, your caregiver will be checking to make sure that you are healing physically and emotionally. °SEEK MEDICAL CARE IF:  °· You are passing large clots from your vagina. Save any clots to show your caregiver. °· You have a foul smelling discharge from your vagina. °· You have trouble urinating. °· You are urinating frequently. °· You have pain when you urinate. °· You have a change in your bowel movements. °· You have increasing redness, pain, or swelling near your vaginal incision (episiotomy) or vaginal tear. °· You have pus draining from your episiotomy or vaginal tear. °· Your episiotomy or vaginal tear is separating. °· You have painful, hard, or reddened breasts. °· You have a severe headache. °· You have blurred vision or see spots. °· You feel sad or depressed. °· You have thoughts of hurting yourself or your newborn. °· You have questions about your care, the care of your newborn, or medicines. °· You are dizzy or light-headed. °· You have a rash. °· You have nausea or vomiting. °· You were breastfeeding and have not had a menstrual period within 12 weeks after you stopped breastfeeding. °· You are not breastfeeding and have not had a menstrual period by the 12th week after delivery. °· You have a fever of 100.5 or more °SEEK IMMEDIATE MEDICAL CARE IF:  °· You have persistent pain. °· You have chest pain. °· You have shortness   of breath. °· You faint. °· You have leg pain. °· You have stomach pain. °· Your vaginal bleeding saturates two or more sanitary pads in 1 hour. °MAKE SURE YOU:  °· Understand these instructions. °· Will watch your condition. °· Will get help right away if you are not doing well or get worse. °Document  Released: 04/01/2000 Document Revised: 08/19/2013 Document Reviewed: 11/30/2011 °ExitCare® Patient Information ©2015 [employer]. This information is not intended to replace advice given to you by your health care provider. Make sure you discuss any questions you have with your health care provider. ° °Sitz Bath °A sitz bath is a warm water bath taken in the sitting position. The water covers only the hips and butt (buttocks). We recommend using one that fits in the toilet, to help with ease of use and cleanliness. It may be used for either healing or cleaning purposes. Sitz baths are also used to relieve pain, itching, or muscle tightening (spasms). The water may contain medicine. Moist heat will help you heal and relax.  °HOME CARE  °Take 3 to 4 sitz baths a day. °18. Fill the bathtub half-full with warm water. °19. Sit in the water and open the drain a little. °20. Turn on the warm water to keep the tub half-full. Keep the water running constantly. °21. Soak in the water for 15 to 20 minutes. °22. After the sitz bath, pat the affected area dry. °GET HELP RIGHT AWAY IF: °You get worse instead of better. Stop the sitz baths if you get worse. °MAKE SURE YOU: °· Understand these instructions. °· Will watch your condition. °· Will get help right away if you are not doing well or get worse. °Document Released: 05/12/2004 Document Revised: 12/28/2011 Document Reviewed: 08/02/2010 °ExitCare® Patient Information ©2015 [employer]. This information is not intended to replace advice given to you by your health care provider. Make sure you discuss any questions you have with your health care provider. ° ° °
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
## 2017-02-26 NOTE — Plan of Care (Signed)
Afeb. VSS. Color good, skn w&d. Alert and oriented with aprop. Affect. Lochia is rubra and small in amount. U/-1. States pain control with scheduled Ibuprofen. Providing aprop. care to self and Infant. Needs assist with Latch during breast feeding.

## 2017-02-26 NOTE — OB Triage Note (Signed)
Patient arrived in triage with c/o contractions since approx 0210 this morning. Denies leaking of fluid or vaginal bleeding. Reports normal fetal movement. EFM monitors applied and assessing.

## 2017-02-27 LAB — CBC
HEMATOCRIT: 34 % — AB (ref 35.0–47.0)
HEMOGLOBIN: 11.2 g/dL — AB (ref 12.0–16.0)
MCH: 30 pg (ref 26.0–34.0)
MCHC: 33.1 g/dL (ref 32.0–36.0)
MCV: 90.9 fL (ref 80.0–100.0)
Platelets: 215 10*3/uL (ref 150–440)
RBC: 3.74 MIL/uL — AB (ref 3.80–5.20)
RDW: 15.8 % — ABNORMAL HIGH (ref 11.5–14.5)
WBC: 11.3 10*3/uL — AB (ref 3.6–11.0)

## 2017-02-27 NOTE — Progress Notes (Signed)
PPD#1 SVD Subjective:  Sitting upright in bed, holding infant, appears fatigued.  Pain control is adequate with ibuprofen. Expresses concerns about breastfeeding. Voiding without difficulty. Tolerating a regular diet. Ambulating well.  Objective:   Blood pressure 110/63, pulse 75, temperature 98 F (36.7 C), temperature source Oral, resp. rate 18, height 5\' 7"  (1.702 m), weight 239 lb (108.4 kg), last menstrual period 06/03/2016, SpO2 99 %, currently breastfeeding.  General: NAD Pulmonary: no increased work of breathing Abdomen: non-distended, non-tender Uterus:  fundus firm at U/1; lochia rubra small Laceration: well-approximated Extremities: trace edema, no erythema, no tenderness, no signs of DVT  Results for orders placed or performed during the hospital encounter of 02/26/17 (from the past 72 hour(s))  CBC     Status: Abnormal   Collection Time: 02/26/17  5:16 AM  Result Value Ref Range   WBC 7.2 3.6 - 11.0 K/uL   RBC 4.31 3.80 - 5.20 MIL/uL   Hemoglobin 13.3 12.0 - 16.0 g/dL   HCT 38.8 35.0 - 47.0 %   MCV 90.0 80.0 - 100.0 fL   MCH 30.8 26.0 - 34.0 pg   MCHC 34.2 32.0 - 36.0 g/dL   RDW 15.6 (H) 11.5 - 14.5 %   Platelets 237 150 - 440 K/uL  CBC     Status: Abnormal   Collection Time: 02/27/17  6:16 AM  Result Value Ref Range   WBC 11.3 (H) 3.6 - 11.0 K/uL   RBC 3.74 (L) 3.80 - 5.20 MIL/uL   Hemoglobin 11.2 (L) 12.0 - 16.0 g/dL   HCT 34.0 (L) 35.0 - 47.0 %   MCV 90.9 80.0 - 100.0 fL   MCH 30.0 26.0 - 34.0 pg   MCHC 33.1 32.0 - 36.0 g/dL   RDW 15.8 (H) 11.5 - 14.5 %   Platelets 215 150 - 440 K/uL    Assessment:   36 y.o. [id number] postpartum day # 1, in stable condition.  Plan:    1) Acute blood loss anemia: hemodynamically stable and asymptomatic - PO ferrous sulfate  2) Blood Type B/Positive/-- (05/09 1140)   3) Rubella 1.31 (05/09 1140) / Varicella Immune / TDAP status: given antepartum 01/20/2017  4) Breastfeeding. She expresses discouragement with  difficulty latching and sore nipples. Advised using coconut oil after every feed, having someone assist with latch and positioning. RN reported that on call lactation consultant can speak to her by phone. Discussed feeding on demand, size of a newborn's stomach, and the difference between colostrum and mature breast milk (amounts, color, content).  5) Contraception: Pill/patch  6) Disposition: continue routine postpartum care and anticipate discharge tomorrow.  Avel Sensor, CNM 02/27/2017  10:55 AM

## 2017-02-28 ENCOUNTER — Telehealth: Payer: Self-pay

## 2017-02-28 LAB — RPR: RPR Ser Ql: NONREACTIVE

## 2017-02-28 MED ORDER — NORETHINDRONE 0.35 MG PO TABS: 1.0000 | ORAL_TABLET | Freq: Every day | ORAL | 11 refills | Status: DC

## 2017-02-28 NOTE — Telephone Encounter (Signed)
Pt inquiring about the status of her breast pump that was ordered a few weeks ago. She is just getting home from the hospital for delivery & is in need of it ASAP. [phone]

## 2017-02-28 NOTE — Telephone Encounter (Signed)
Spoke w/[employer] who states they are waiting for pt to contact them with her breast pump choice. LMVM to notify pt. [employer] phone# & website given to patient to contact for order completion.

## 2017-02-28 NOTE — Progress Notes (Signed)
Reviewed all patients discharge instructions and handouts regarding postpartum bleeding, no intercourse for 6 weeks, signs and symptoms of mastitis and postpartum bleu's. Reviewed discharge instructions for newborn regarding proper cord care, how and when to bathe the newborn, nail care, proper way to take the baby's temperature, along with safe sleep. All questions have been answered at this time. Patient discharged via wheelchair with axillary.

## 2017-02-28 NOTE — Telephone Encounter (Signed)
FMLA/DISABILITY form for [employer] filled out and given to [place] for processing.

## 2017-02-28 NOTE — Lactation Note (Signed)
This note was copied from a baby's chart. Lactation Consultation Note  Patient Name: [name] [id number] Date: 02/28/2017 Reason for consult: Initial assessment;Mother's request;Difficult latch;1st time breastfeeding  Mom c/o difficulty with breastfeeding recently, so started to bottle feed formula and then today started to pump. She states she will be getting DEBP -personal use from insurance company. I taught her manual pump too to tide her over til her pump arrives, but hopefully now she will just breastfeed. I made some very minor suggestions to help with position and latch and Mom said it felt "good". I reviewed BF basics from BF booklet she has.  I also taught famil;y how to help out. I gave her IBCLC and [facility] contact info.   Maternal Data Has patient been taught Hand Expression?: Yes  Feeding Feeding Type: Breast Fed  LATCH Score Latch: Grasps breast easily, tongue down, lips flanged, rhythmical sucking.  Audible Swallowing: A few with stimulation(with compression)  Type of Nipple: Everted at rest and after stimulation  Comfort (Breast/Nipple): Filling, red/small blisters or bruises, mild/mod discomfort(bruise on nipple edges)  Hold (Positioning): Assistance needed to correctly position infant at breast and maintain latch.  LATCH Score: 7  Interventions Interventions: Breast feeding basics reviewed;Assisted with latch;Skin to skin;Breast massage;Hand express;Adjust position;Support pillows;Position options;Expressed milk;Hand pump  Lactation Tools Discussed/Used     Consult Status Consult Status: PRN    Roque Cash 02/28/2017, 10:34 AM

## 2017-03-03 ENCOUNTER — Encounter: Payer: 59 | Admitting: Obstetrics and Gynecology

## 2017-03-06 ENCOUNTER — Encounter (INDEPENDENT_AMBULATORY_CARE_PROVIDER_SITE_OTHER): Payer: Self-pay

## 2017-03-07 ENCOUNTER — Ambulatory Visit (INDEPENDENT_AMBULATORY_CARE_PROVIDER_SITE_OTHER): Payer: 59 | Admitting: Certified Nurse Midwife

## 2017-03-07 ENCOUNTER — Encounter: Payer: Self-pay | Admitting: Certified Nurse Midwife

## 2017-03-07 VITALS — BP 104/66 | HR 60 | Ht 67.0 in | Wt 224.0 lb

## 2017-03-07 DIAGNOSIS — O1205 Gestational edema, complicating the puerperium: Secondary | ICD-10-CM

## 2017-03-07 NOTE — Telephone Encounter (Signed)
Pt opts to come in at 2pm today.  LM for SP to add to CLG's sched and called [facility] to make her aware.

## 2017-03-07 NOTE — Telephone Encounter (Signed)
Please have this patient come see me at either 1100 or 2:00 PM today.
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
## 2017-03-14 ENCOUNTER — Encounter (INDEPENDENT_AMBULATORY_CARE_PROVIDER_SITE_OTHER): Payer: Self-pay

## 2017-03-15 ENCOUNTER — Ambulatory Visit (INDEPENDENT_AMBULATORY_CARE_PROVIDER_SITE_OTHER): Payer: 59 | Admitting: Certified Nurse Midwife

## 2017-03-15 ENCOUNTER — Encounter: Payer: Self-pay | Admitting: Certified Nurse Midwife

## 2017-03-15 VITALS — BP 104/66 | HR 60 | Ht 67.0 in | Wt 211.0 lb

## 2017-03-15 DIAGNOSIS — R600 Localized edema: Secondary | ICD-10-CM
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
## 2017-04-13 ENCOUNTER — Encounter: Payer: Self-pay | Admitting: Certified Nurse Midwife

## 2017-04-13 ENCOUNTER — Ambulatory Visit (INDEPENDENT_AMBULATORY_CARE_PROVIDER_SITE_OTHER): Payer: 59 | Admitting: Certified Nurse Midwife

## 2017-04-13 ENCOUNTER — Telehealth: Payer: Self-pay | Admitting: Certified Nurse Midwife

## 2017-04-13 DIAGNOSIS — Z3009 Encounter for other general counseling and advice on contraception: Secondary | ICD-10-CM

## 2017-04-13 NOTE — Telephone Encounter (Signed)
05/05/17 at 11:30 with clg for nexplanon insert.
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
## 2017-04-26 ENCOUNTER — Encounter (INDEPENDENT_AMBULATORY_CARE_PROVIDER_SITE_OTHER): Payer: Self-pay

## 2017-05-04 ENCOUNTER — Telehealth: Payer: Self-pay

## 2017-05-04 NOTE — Telephone Encounter (Signed)
Additional FMLA/DISABILITY form filled out for [employer], signature obtained, and given to [place] for processing.

## 2017-05-05 ENCOUNTER — Ambulatory Visit: Payer: 59 | Admitting: Certified Nurse Midwife

## 2017-05-24 ENCOUNTER — Telehealth: Payer: Self-pay

## 2017-05-24 NOTE — Telephone Encounter (Signed)
FMLA/DISABILITY form additional info for [employer] filled out, signature obtained, and given to [place] for processing.

## 2017-06-13 NOTE — Telephone Encounter (Signed)
Pt cancelled apt

## 2017-09-19 ENCOUNTER — Other Ambulatory Visit: Payer: Self-pay | Admitting: Family Medicine

## 2017-09-19 ENCOUNTER — Other Ambulatory Visit
Admission: RE | Admit: 2017-09-19 | Discharge: 2017-09-19 | Disposition: A | Discharge status: Home / Self Care | Payer: 59 | Source: Ambulatory Visit | Attending: Family Medicine | Admitting: Family Medicine

## 2017-09-19 ENCOUNTER — Ambulatory Visit
Admission: RE | Admit: 2017-09-19 | Discharge: 2017-09-19 | Disposition: A | Discharge status: Home / Self Care | Payer: 59 | Source: Ambulatory Visit | Attending: Family Medicine | Admitting: Family Medicine

## 2017-09-19 DIAGNOSIS — M545 Low back pain: Secondary | ICD-10-CM

## 2018-01-03 ENCOUNTER — Ambulatory Visit: Payer: Self-pay | Admitting: Obstetrics and Gynecology

## 2018-01-04 ENCOUNTER — Ambulatory Visit (INDEPENDENT_AMBULATORY_CARE_PROVIDER_SITE_OTHER): Payer: 59 | Admitting: Obstetrics and Gynecology

## 2018-01-04 ENCOUNTER — Encounter: Payer: Self-pay | Admitting: Obstetrics and Gynecology

## 2018-01-04 VITALS — BP 120/72 | HR 74 | Ht 67.0 in | Wt 224.0 lb

## 2018-01-04 DIAGNOSIS — R6889 Other general symptoms and signs: Secondary | ICD-10-CM

## 2018-01-04 DIAGNOSIS — Z30014 Encounter for initial prescription of intrauterine contraceptive device: Secondary | ICD-10-CM | POA: Diagnosis not present
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
## 2018-01-04 NOTE — Patient Instructions (Signed)
I value your feedback and entrusting us with your care. If you get a [place] patient survey, I would appreciate you taking the time to let us know about your experience today. Thank you! 

## 2018-01-18 ENCOUNTER — Telehealth: Payer: Self-pay | Admitting: Obstetrics and Gynecology

## 2018-01-18 NOTE — Telephone Encounter (Signed)
Patient scheduled 10/7 for Mirena insertion with ABC

## 2018-01-22 ENCOUNTER — Ambulatory Visit (INDEPENDENT_AMBULATORY_CARE_PROVIDER_SITE_OTHER): Payer: 59 | Admitting: Obstetrics and Gynecology

## 2018-01-22 ENCOUNTER — Encounter: Payer: Self-pay | Admitting: Obstetrics and Gynecology

## 2018-01-22 VITALS — BP 120/70 | HR 75 | Ht 67.0 in | Wt 224.0 lb

## 2018-01-22 DIAGNOSIS — Z30430 Encounter for insertion of intrauterine contraceptive device: Secondary | ICD-10-CM

## 2018-01-22 DIAGNOSIS — R6889 Other general symptoms and signs: Secondary | ICD-10-CM

## 2018-01-22 MED ORDER — LEVONORGESTREL 20 MCG/24HR IU IUD: 1.0000 | INTRAUTERINE_SYSTEM | Freq: Once | INTRAUTERINE | 0 refills | Status: DC

## 2018-01-22 NOTE — Progress Notes (Signed)
   Chief Complaint  Patient presents with  . Contraception    IUD insertion,pt would like to get blood work done to see about the hot flashes shes been getting.     IUD PROCEDURE NOTE:  [name] is a 37 y.o. [phone] here for Mirena  IUD insertion for [facility].  Last pap 5/18. PP visit 12/18.   BP 120/70   Pulse 75   Ht 5\' 7"  (1.702 m)   Wt 224 lb (101.6 kg)   LMP 12/19/2017 (Exact Date)   Breastfeeding? No   BMI 35.08 kg/m   IUD Insertion Procedure Note Patient identified, informed consent performed, consent signed.   Discussed risks of irregular bleeding, cramping, infection, malpositioning or misplacement of the IUD outside the uterus which may require further procedure such as laparoscopy, risk of failure <1%. Time out was performed.   Speculum placed in the vagina.  Cervix visualized.  Cleaned with Betadine x 2.  Grasped anteriorly with a single tooth tenaculum.  Uterus sounded to 9.0 cm.   IUD placed per manufacturer's recommendations.  Strings trimmed to 3 cm. Tenaculum was removed, good hemostasis noted.  Patient tolerated procedure well.   ASSESSMENT:  Encounter for insertion of intrauterine contraceptive device (IUD) - Plan: levonorgestrel (MIRENA, 52 MG,) 20 MCG/24HR IUD  Sensation of feeling hot - Labs today. Will call with results.  - Plan: TSH + free T4, Hemoglobin A1c   Meds ordered this encounter  Medications  . levonorgestrel (MIRENA, 52 MG,) 20 MCG/24HR IUD    Sig: 1 Intra Uterine Device (1 each total) by Intrauterine route once for 1 dose.    Dispense:  1 Intra Uterine Device    Refill:  0    Order Specific Question:   Supervising Provider    Answer:   Gae Dry [[id number]]     Plan:  Patient was given post-procedure instructions.  She was advised to have backup contraception for one week.   Call if you are having increasing pain, cramps or bleeding or if you have a fever greater than 100.4 degrees F., shaking chills, nausea or vomiting. Patient  was also asked to check IUD strings periodically and follow up in 4 weeks for IUD check.  Return in about 4 weeks (around 02/19/2018) for IUD f/u.  Alicia B. Copland, PA-C 01/22/2018 10:22 AM

## 2018-01-22 NOTE — Patient Instructions (Signed)
I value your feedback and entrusting us with your care. If you get a [place] patient survey, I would appreciate you taking the time to let us know about your experience today. Thank you!  [facility] [phone]  Instructions after IUD insertion  Most women experience no significant problems after insertion of an IUD, however minor cramping and spotting for a few days is common. Cramps may be treated with ibuprofen 800mg every 8 hours or Tylenol 650 mg every 4 hours. Contact [facility] immediately if you experience any of the following symptoms during the next week: temperature >99.6 degrees, worsening pelvic pain, abdominal pain, fainting, unusually heavy vaginal bleeding, foul vaginal discharge, or if you think you have expelled the IUD.  Nothing inserted in the vagina for 48 hours. You will be scheduled for a follow up visit in approximately four weeks.  You should check monthly to be sure you can feel the IUD strings in the upper vagina. If you are having a monthly period, try to check after each period. If you cannot feel the IUD strings,  contact [facility] immediately so we can do an exam to determine if the IUD has been expelled.   Please use backup protection until we can confirm the IUD is in place.  Call [facility] if you are exposed to or diagnosed with a sexually transmitted infection, as we will need to discuss whether it is safe for you to continue using an IUD.   

## 2018-01-23 LAB — HEMOGLOBIN A1C
ESTIMATED AVERAGE GLUCOSE: 108 mg/dL
Hgb A1c MFr Bld: 5.4 % (ref 4.8–5.6)

## 2018-01-23 LAB — TSH+FREE T4
Free T4: 1.17 ng/dL (ref 0.82–1.77)
TSH: 1.63 u[IU]/mL (ref 0.450–4.500)

## 2018-01-23 NOTE — Progress Notes (Signed)
Pt aware.

## 2018-01-23 NOTE — Progress Notes (Signed)
Pls let pt know labs normal. She just may be hot-natured. Nothing else to do. Thx.

## 2018-02-19 ENCOUNTER — Ambulatory Visit: Payer: 59 | Admitting: Obstetrics and Gynecology

## 2018-02-20 ENCOUNTER — Encounter: Payer: Self-pay | Admitting: Obstetrics and Gynecology

## 2018-02-20 ENCOUNTER — Ambulatory Visit (INDEPENDENT_AMBULATORY_CARE_PROVIDER_SITE_OTHER): Payer: 59 | Admitting: Obstetrics and Gynecology

## 2018-02-20 VITALS — BP 130/76 | HR 66 | Ht 67.0 in | Wt 223.0 lb

## 2018-02-20 DIAGNOSIS — Z23 Encounter for immunization: Secondary | ICD-10-CM | POA: Diagnosis not present

## 2018-02-20 DIAGNOSIS — Z30431 Encounter for routine checking of intrauterine contraceptive device: Secondary | ICD-10-CM

## 2018-02-20 NOTE — Patient Instructions (Signed)
I value your feedback and entrusting us with your care. If you get a [name] patient survey, I would appreciate you taking the time to let us know about your experience today. Thank you! 

## 2018-02-20 NOTE — Progress Notes (Signed)
   Chief Complaint  Patient presents with  . Follow-up    IUD check, wants the flu shot today     History of Present Illness:  [name] is a 37 y.o. that had a Mirena IUD placed approximately 1 month ago. Since that time, she denies dyspareunia, pelvic pain, vaginal d/c, heavy bleeding. Dysmen improved, having light spotting/bleeding. Doing well. Likes it so far.  Review of Systems  Constitutional: Negative for fever.  Gastrointestinal: Negative for blood in stool, constipation, diarrhea, nausea and vomiting.  Genitourinary: Positive for vaginal bleeding. Negative for dyspareunia, dysuria, flank pain, frequency, hematuria, urgency, vaginal discharge and vaginal pain.  Musculoskeletal: Negative for back pain.  Skin: Negative for rash.    Physical Exam:  BP 130/76   Pulse 66   Ht 5\' 7"  (1.702 m)   Wt 223 lb (101.2 kg)   Breastfeeding? No   BMI 34.93 kg/m  Body mass index is 34.93 kg/m.  Pelvic exam:  Two IUD strings present seen coming from the cervical os. EGBUS, vaginal vault and cervix: within normal limits   Assessment:   Encounter for routine checking of intrauterine contraceptive device (IUD)  IUD strings present in proper location; pt doing well  Plan: F/u if any signs of infection or can no longer feel the strings.   Dillin Lofgren B. Jahvon Gosline, PA-C 02/20/2018 11:13 AM

## 2018-02-20 NOTE — Addendum Note (Signed)
Addended by: Drenda Freeze on: 02/20/2018 11:20 AM   Modules accepted: Orders

## 2018-03-11 IMAGING — US US OB TRANSVAGINAL
1 series · 14 of 28 positions shown · non-contrast
Comparison: None.

CLINICAL DATA: Abdominal pain. No vaginal bleeding. Beta HCG 47,448

EXAM:
OBSTETRIC <14 WK US AND TRANSVAGINAL OB US
TECHNIQUE: Both transabdominal and transvaginal ultrasound examinations were
performed for complete evaluation of the gestation as well as the
maternal uterus, adnexal regions, and pelvic cul-de-sac.
Transvaginal technique was performed to assess early pregnancy.

[Series 1: us ob transvaginal · 0.20mm/px · 14 of 55 slices shown]
[im 3/55]
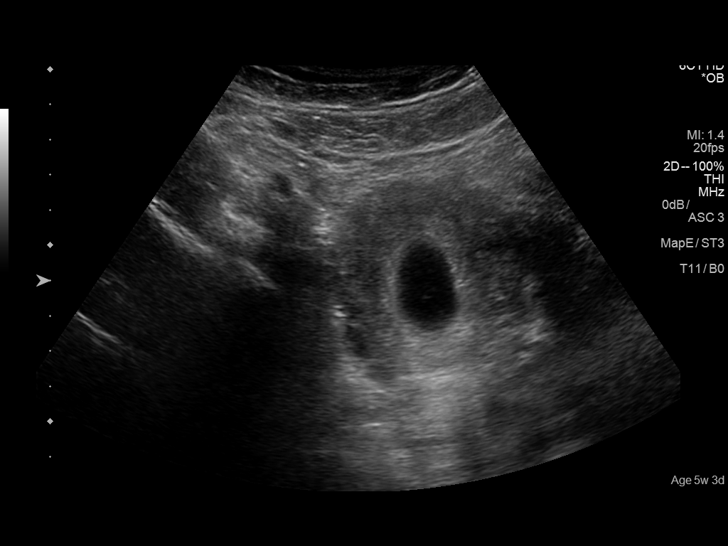
[im 7/55]
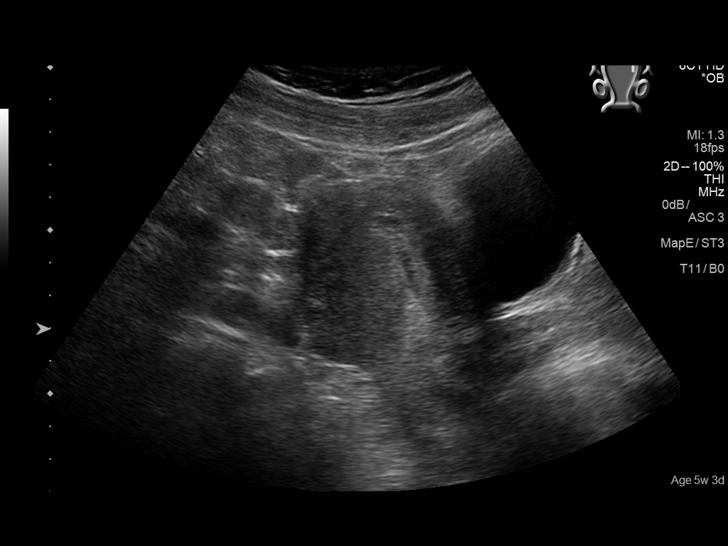
[im 11/55]
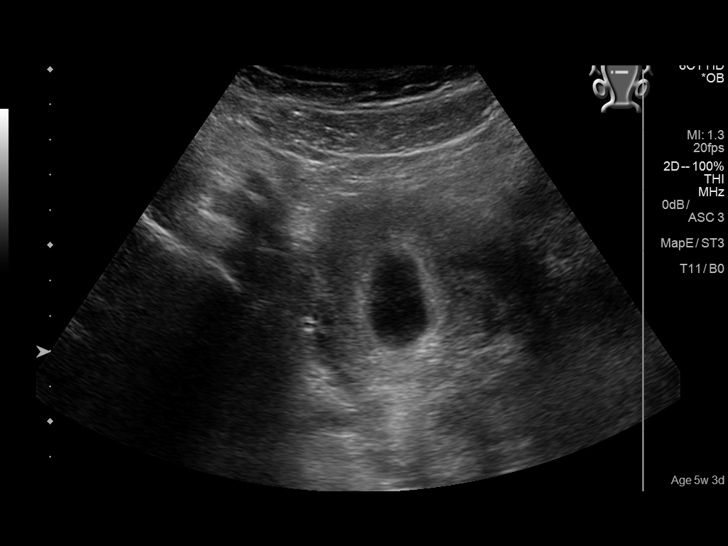
[im 15/55]
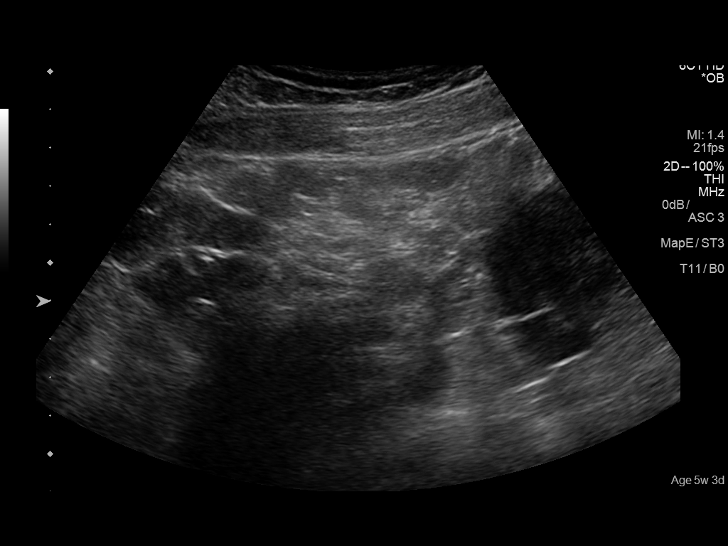
[im 19/55]
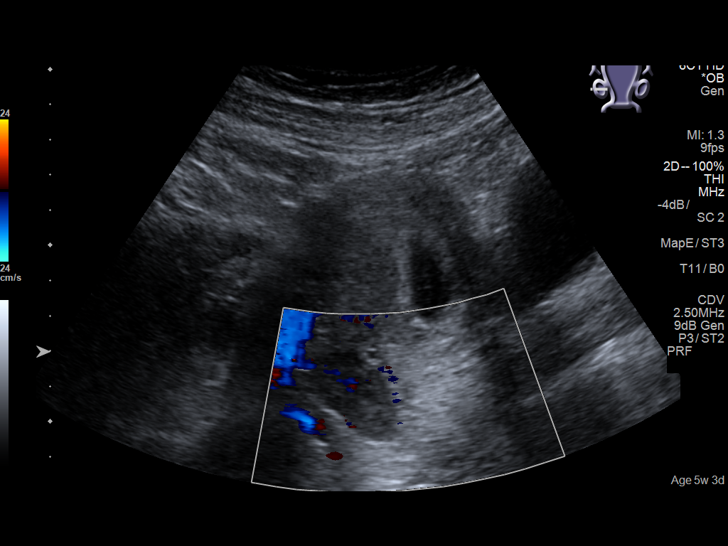
[im 23/55]
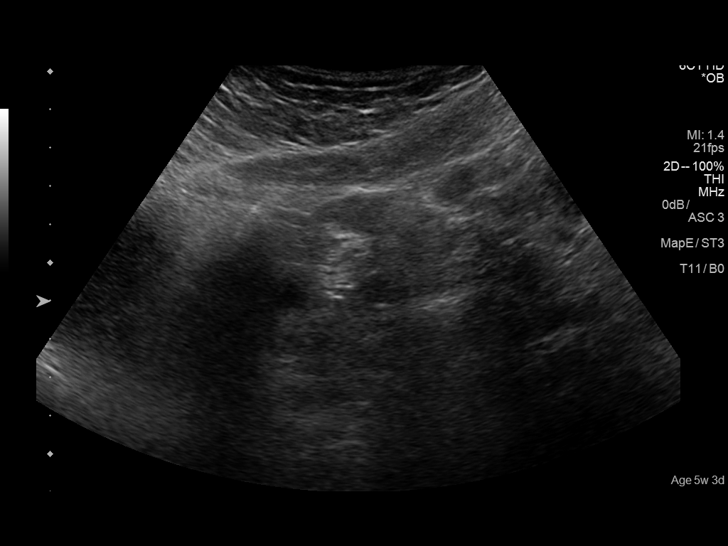
[im 27/55]
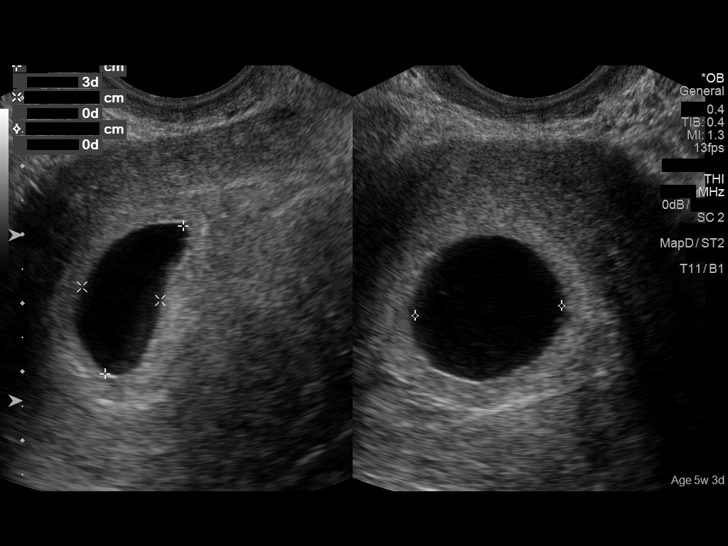
[im 31/55]
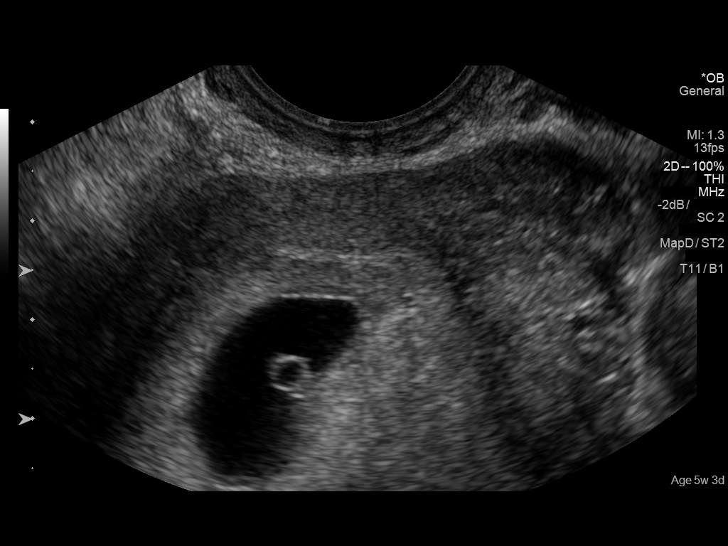
[im 35/55]
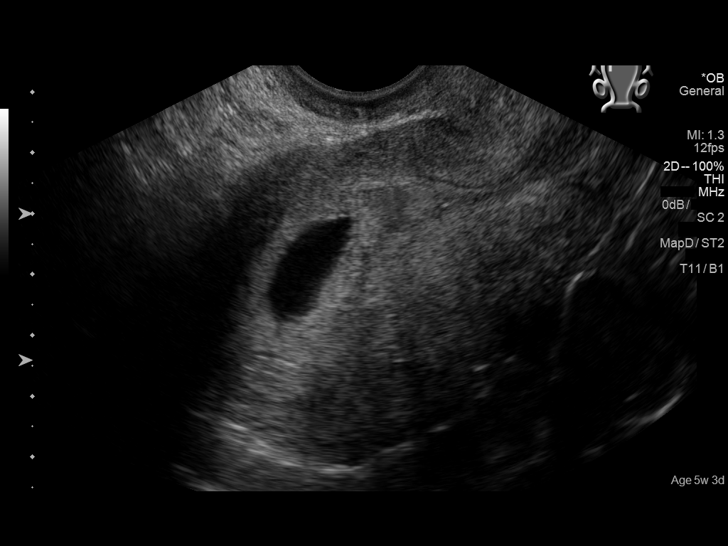
[im 39/55]
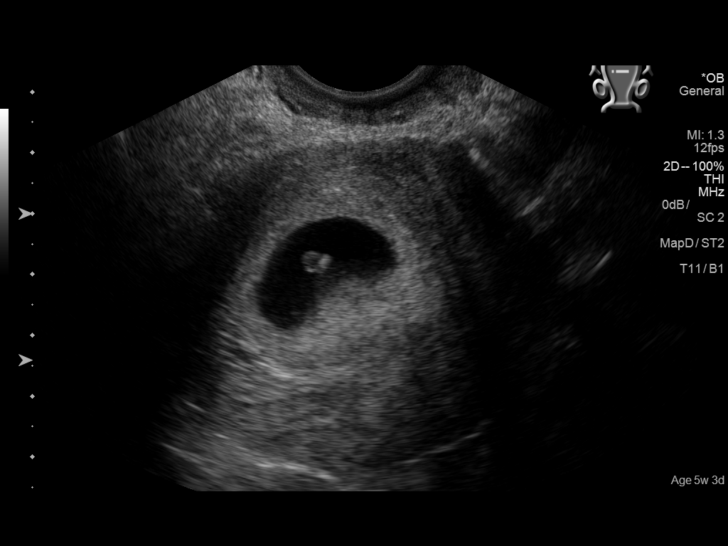
[im 43/55]
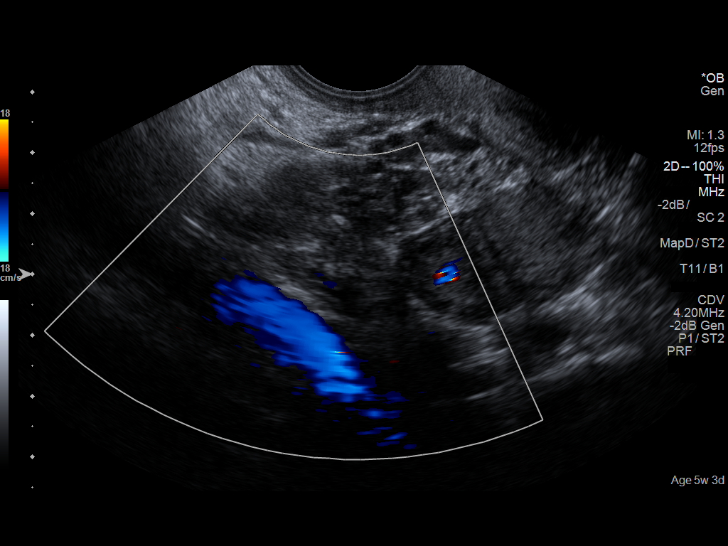
[im 47/55]
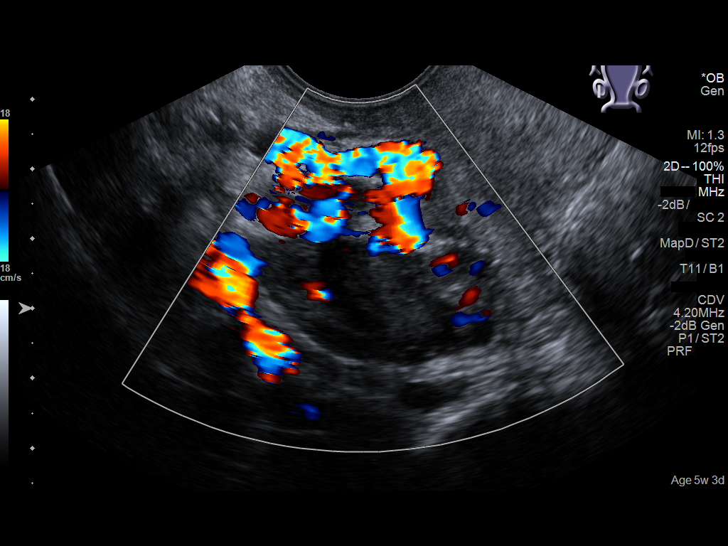
[im 51/55]
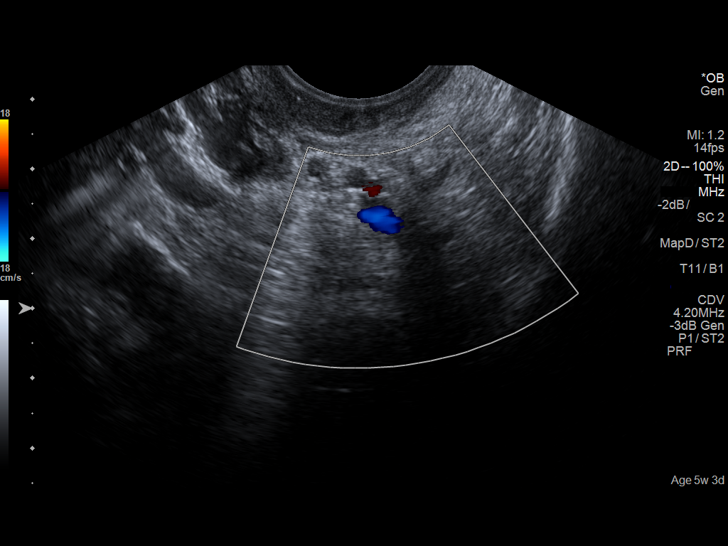
[im 55/55]
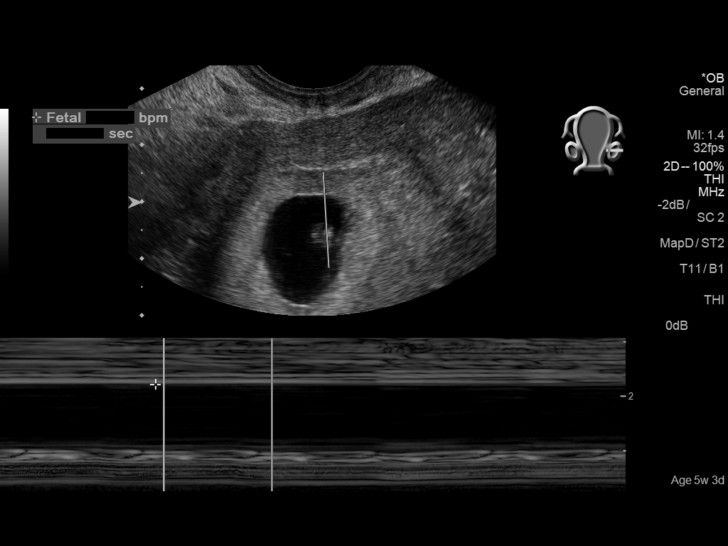

[14 of 28 positions shown; findings below may reference images not displayed]

FINDINGS: Intrauterine gestational sac: Single

Yolk sac:  Visualized.

Embryo:  Visualized.

Cardiac Activity: Visualized.

Heart Rate: 95  bpm

CRL:  3.2  mm   6 w   0 d                  US EDC: 09/03/2016

Subchorionic hemorrhage:  None visualized.

Maternal uterus/adnexae: Normal
IMPRESSION: 1. Viable 6 week 0 day single live intrauterine gestation.
2. Low gestational heart rate at 95 beats per minute for this stage
of gestation. This has been associated with poor future viability of
the pregnancy. Critical Value/emergent results were called by
telephone at the time of interpretation on 07/11/2016 at [DATE] to
Dr. PT EST IMENAA , who verbally acknowledged these results.
3. No subchorionic hemorrhage.

## 2018-05-23 ENCOUNTER — Other Ambulatory Visit: Payer: Self-pay | Admitting: Physical Medicine and Rehabilitation

## 2018-05-23 DIAGNOSIS — M5127 Other intervertebral disc displacement, lumbosacral region: Secondary | ICD-10-CM

## 2018-05-25 ENCOUNTER — Ambulatory Visit
Admission: RE | Admit: 2018-05-25 | Discharge: 2018-05-25 | Disposition: A | Discharge status: Home / Self Care | Payer: 59 | Source: Ambulatory Visit | Attending: Physical Medicine and Rehabilitation | Admitting: Physical Medicine and Rehabilitation

## 2018-05-25 DIAGNOSIS — M5127 Other intervertebral disc displacement, lumbosacral region: Secondary | ICD-10-CM

## 2019-06-04 NOTE — Telephone Encounter (Signed)
Mirena rcvd/charged 01/22/18

## 2020-01-14 ENCOUNTER — Ambulatory Visit (INDEPENDENT_AMBULATORY_CARE_PROVIDER_SITE_OTHER): Payer: Medicaid Other | Admitting: Obstetrics and Gynecology

## 2020-01-14 ENCOUNTER — Encounter: Payer: Self-pay | Admitting: Obstetrics and Gynecology

## 2020-01-14 ENCOUNTER — Other Ambulatory Visit (HOSPITAL_COMMUNITY)
Admission: RE | Admit: 2020-01-14 | Discharge: 2020-01-14 | Disposition: A | Discharge status: Home / Self Care | Payer: Medicaid Other | Source: Ambulatory Visit | Attending: Obstetrics and Gynecology | Admitting: Obstetrics and Gynecology

## 2020-01-14 ENCOUNTER — Other Ambulatory Visit: Payer: Self-pay

## 2020-01-14 VITALS — BP 120/72 | HR 68 | Resp 18 | Ht 67.0 in | Wt 214.8 lb

## 2020-01-14 DIAGNOSIS — Z113 Encounter for screening for infections with a predominantly sexual mode of transmission: Secondary | ICD-10-CM | POA: Insufficient documentation

## 2020-01-14 DIAGNOSIS — E559 Vitamin D deficiency, unspecified: Secondary | ICD-10-CM

## 2020-01-14 DIAGNOSIS — Z30431 Encounter for routine checking of intrauterine contraceptive device: Secondary | ICD-10-CM | POA: Diagnosis not present

## 2020-01-14 DIAGNOSIS — Z1329 Encounter for screening for other suspected endocrine disorder: Secondary | ICD-10-CM

## 2020-01-14 DIAGNOSIS — Z124 Encounter for screening for malignant neoplasm of cervix: Secondary | ICD-10-CM | POA: Insufficient documentation

## 2020-01-14 DIAGNOSIS — Z130 Encounter for screening for diseases of the blood and blood-forming organs and certain disorders involving the immune mechanism: Secondary | ICD-10-CM

## 2020-01-14 DIAGNOSIS — Z131 Encounter for screening for diabetes mellitus: Secondary | ICD-10-CM

## 2020-01-14 DIAGNOSIS — N920 Excessive and frequent menstruation with regular cycle: Secondary | ICD-10-CM

## 2020-01-14 DIAGNOSIS — N852 Hypertrophy of uterus: Secondary | ICD-10-CM

## 2020-01-14 DIAGNOSIS — Z1239 Encounter for other screening for malignant neoplasm of breast: Secondary | ICD-10-CM | POA: Insufficient documentation

## 2020-01-14 DIAGNOSIS — Z01419 Encounter for gynecological examination (general) (routine) without abnormal findings: Secondary | ICD-10-CM

## 2020-01-14 DIAGNOSIS — Z0000 Encounter for general adult medical examination without abnormal findings: Secondary | ICD-10-CM

## 2020-01-14 DIAGNOSIS — L659 Nonscarring hair loss, unspecified: Secondary | ICD-10-CM

## 2020-01-14 NOTE — Progress Notes (Signed)
Pt states she having a annual exam  And she thinks she want to remove her IUD.
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
## 2020-01-14 NOTE — Patient Instructions (Signed)
Alopecia Areata, Adult  Alopecia areata is a condition that causes you to lose hair. You may lose hair on your scalp in patches. In some cases, you may lose all the hair on your scalp (alopecia totalis) or all the hair from your face and body (alopecia universalis). Alopecia areata is an autoimmune disease. This means that your body's defense system (immune system) mistakes normal parts of the body for germs or other things that can make you sick. When you have alopecia areata, the immune system attacks the hair follicles. Alopecia areata usually develops in childhood, but it can develop at any age. For some people, their hair grows back on its own and hair loss does not happen again. For others, their hair may fall out and grow back in cycles. The hair loss may last many years. Having this condition can be emotionally difficult, but it is not dangerous. What are the causes? The cause of this condition is not known. What increases the risk? This condition is more likely to develop in people who have:  A family history of alopecia.  A family history of another autoimmune disease, including type 1 diabetes and rheumatoid arthritis.  Asthma and allergies.  Down syndrome. What are the signs or symptoms? Round spots of patchy hair loss on the scalp is the main symptom of this condition. The spots may be mildly itchy. Other symptoms include:  Short dark hairs in the bald patches that are wider at the top (exclamation point hairs).  Dents, white spots, or lines in the fingernails or toenails.  Balding and body hair loss. This is rare. How is this diagnosed? This condition is diagnosed based on your symptoms and family history. Your health care provider will also check your scalp skin, teeth, and nails. Your health care provider may refer you to a specialist in hair and skin disorders (dermatologist). You may also have tests, including:  A hair pull test.  Blood tests or other screening  tests to check for autoimmune diseases, such as thyroid disease or diabetes.  Skin biopsy to confirm the diagnosis.  A procedure to examine the skin with a lighted magnifying instrument (dermoscopy). How is this treated? There is no cure for alopecia areata. Treatment is aimed at promoting the regrowth of hair and preventing the immune system from overreacting. No single treatment is right for all people with alopecia areata. It depends on the type of hair loss you have and how severe it is. Work with your health care provider to find the best treatment for you. Treatment may include:  Having regular checkups to make sure the condition is not getting worse (watchful waiting).  Steroid creams or pills for 6-8 weeks to stop the immune reaction and help hair to regrow more quickly.  Other topical medicines to alter the immune system response and support the hair growth cycle.  Steroid injections.  Therapy and counseling with a support group or therapist if you are having trouble coping with hair loss. Follow these instructions at home:  Learn as much as you can about your condition.  Apply topical creams only as told by your health care provider.  Take over-the-counter and prescription medicines only as told by your health care provider.  Consider getting a wig or products to make hair look fuller or to cover bald spots, if you feel uncomfortable with your appearance.  Get therapy or counseling if you are having a hard time coping with hair loss. Ask your health care provider to  recommend a counselor or support group.  Keep all follow-up visits as told by your health care provider. This is important. Contact a health care provider if:  Your hair loss gets worse, even with treatment.  You have new symptoms.  You are struggling emotionally. Summary  Alopecia areata is an autoimmune condition that makes your body's defense system (immune system) attack the hair follicles. This causes  you to lose hair.  Treatments may include regular checkups to make sure that the condition is not getting worse (watchful waiting), medicines, and steroid injections. This information is not intended to replace advice given to you by your health care provider. Make sure you discuss any questions you have with your health care provider. Document Revised: 03/17/2017 Document Reviewed: 04/22/2016 Elsevier Patient Education  2020 [employer].    Exercising to Stay Healthy To become healthy and stay healthy, it is recommended that you do moderate-intensity and vigorous-intensity exercise. You can tell that you are exercising at a moderate intensity if your heart starts beating faster and you start breathing faster but can still hold a conversation. You can tell that you are exercising at a vigorous intensity if you are breathing much harder and faster and cannot hold a conversation while exercising. Exercising regularly is important. It has many health benefits, such as:  Improving overall fitness, flexibility, and endurance.  Increasing bone density.  Helping with weight control.  Decreasing body fat.  Increasing muscle strength.  Reducing stress and tension.  Improving overall health. How often should I exercise? Choose an activity that you enjoy, and set realistic goals. Your health care provider can help you make an activity plan that works for you. Exercise regularly as told by your health care provider. This may include:  Doing strength training two times a week, such as: ? Lifting weights. ? Using resistance bands. ? Push-ups. ? Sit-ups. ? Yoga.  Doing a certain intensity of exercise for a given amount of time. Choose from these options: ? A total of 150 minutes of moderate-intensity exercise every week. ? A total of 75 minutes of vigorous-intensity exercise every week. ? A mix of moderate-intensity and vigorous-intensity exercise every week. Children, pregnant women,  people who have not exercised regularly, people who are overweight, and older adults may need to talk with a health care provider about what activities are safe to do. If you have a medical condition, be sure to talk with your health care provider before you start a new exercise program. What are some exercise ideas? Moderate-intensity exercise ideas include:  Walking 1 mile (1.6 km) in about 15 minutes.  Biking.  Hiking.  Golfing.  Dancing.  Water aerobics. Vigorous-intensity exercise ideas include:  Walking 4.5 miles (7.2 km) or more in about 1 hour.  Jogging or running 5 miles (8 km) in about 1 hour.  Biking 10 miles (16.1 km) or more in about 1 hour.  Lap swimming.  Roller-skating or in-line skating.  Cross-country skiing.  Vigorous competitive sports, such as football, basketball, and soccer.  Jumping rope.  Aerobic dancing. What are some everyday activities that can help me to get exercise?  [place] work, such as: ? Pushing a Conservation officer, nature. ? Raking and bagging leaves.  Washing your car.  Pushing a stroller.  Shoveling snow.  Gardening.  Washing windows or floors. How can I be more active in my day-to-day activities?  Use stairs instead of an elevator.  Take a walk during your lunch break.  If you drive, park your  car farther away from your work or school.  If you take public transportation, get off one stop early and walk the rest of the way.  Stand up or walk around during all of your indoor phone calls.  Get up, stretch, and walk around every 30 minutes throughout the day.  Enjoy exercise with a friend. Support to continue exercising will help you keep a regular routine of activity. What guidelines can I follow while exercising?  Before you start a new exercise program, talk with your health care provider.  Do not exercise so much that you hurt yourself, feel dizzy, or get very short of breath.  Wear comfortable clothes and wear shoes with good  support.  Drink plenty of water while you exercise to prevent dehydration or heat stroke.  Work out until your breathing and your heartbeat get faster. Where to find more information  [place].S. Department of Health and Human Services: BondedCompany.at  Centers for Disease Control and Prevention (CDC): http://www.wolf.info/ Summary  Exercising regularly is important. It will improve your overall fitness, flexibility, and endurance.  Regular exercise also will improve your overall health. It can help you control your weight, reduce stress, and improve your bone density.  Do not exercise so much that you hurt yourself, feel dizzy, or get very short of breath.  Before you start a new exercise program, talk with your health care provider. This information is not intended to replace advice given to you by your health care provider. Make sure you discuss any questions you have with your health care provider. Document Revised: 03/17/2017 Document Reviewed: 02/23/2017 Elsevier Patient Education  [place].   Budget-Friendly Healthy Eating There are many ways to save money at the grocery store and continue to eat healthy. You can be successful if you:  Plan meals according to your budget.  Make a grocery list and only purchase food according to your grocery list.  Prepare food yourself. What are tips for following this plan?  Reading food labels  Compare food labels between brand name foods and the store brand. Often the nutritional value is the same, but the store brand is lower cost.  Look for products that do not have added sugar, fat, or salt (sodium). These often cost the same but are healthier for you. Products may be labeled as: ? Sugar-free. ? Nonfat. ? Low-fat. ? Sodium-free. ? Low-sodium.  Look for lean ground beef labeled as at least 92% lean and 8% fat. Shopping  Buy only the items on your grocery list and go only to the areas of the store that have the items on your  list.  Use coupons only for foods and brands you normally buy. Avoid buying items you wouldn't normally buy simply because they are on sale.  Check online and in newspapers for weekly deals.  Buy healthy items from the bulk bins when available, such as herbs, spices, flour, pasta, nuts, and dried fruit.  Buy fruits and vegetables that are in season. Prices are usually lower on in-season produce.  Look at the unit price on the price tag. Use it to compare different brands and sizes to find out which item is the best deal.  Choose healthy items that are often low-cost, such as carrots, potatoes, apples, bananas, and oranges. Dried or canned beans are a low-cost protein source.  Buy in bulk and freeze extra food. Items you can buy in bulk include meats, fish, poultry, frozen fruits, and frozen vegetables.  Avoid buying "ready-to-eat" foods,  such as pre-cut fruits and vegetables and pre-made salads.  If possible, shop around to discover where you can find the best prices. Consider other retailers such as dollar stores, larger [employer], local fruit and vegetable stands, and farmers markets.  Do not shop when you are hungry. If you shop while hungry, it may be hard to stick to your list and budget.  Resist impulse buying. Use your grocery list as your official plan for the week.  Buy a variety of vegetables and fruits by purchasing fresh, frozen, and canned items.  Look at the top and bottom shelves for deals. Foods at eye level (eye level of an adult or child) are usually more expensive.  Be efficient with your time when shopping. The more time you spend at the store, the more money you are likely to spend.  To save money when choosing more expensive foods like meats and dairy: ? Choose cheaper cuts of meat, such as bone-in chicken thighs and drumsticks instead of skinless and boneless chicken. When you are ready to prepare the chicken, you can remove the skin yourself to make it  healthier. ? Choose lean meats like chicken or [place] instead of beef. ? Choose canned seafood, such as tuna, salmon, or sardines. ? Buy eggs as a low-cost source of protein. ? Buy dried beans and peas, such as lentils, split peas, or kidney beans instead of meats. Dried beans and peas are a good alternative source of protein. ? Buy the larger tubs of yogurt instead of individual-sized containers.  Choose water instead of sodas and other sweetened beverages.  Avoid buying chips, cookies, and other "junk food." These items are usually expensive and not healthy. Cooking  Make extra food and freeze the extras in meal-sized containers or in individual portions for fast meals and snacks.  Pre-cook on days when you have extra time to prepare meals in advance. You can keep these meals in the fridge or freezer and reheat for a quick meal.  When you come home from the grocery store, wash, peel, and cut fruits and vegetables so they are ready to use and eat. This will help reduce food waste. Meal planning  Do not eat out or get fast food. Prepare food at home.  Make a grocery list and make sure to bring it with you to the store. If you have a smart phone, you could use your phone to create your shopping list.  Plan meals and snacks according to a grocery list and budget you create.  Use leftovers in your meal plan for the week.  Look for recipes where you can cook once and make enough food for two meals.  Include budget-friendly meals like stews, casseroles, and stir-fry dishes.  Try some meatless meals or try "no cook" meals like salads.  Make sure that half your plate is filled with fruits or vegetables. Choose from fresh, frozen, or canned fruits and vegetables. If eating canned, remember to rinse them before eating. This will remove any excess salt added for packaging. Summary  Eating healthy on a budget is possible if you plan your meals according to your budget, purchase according to  your budget and grocery list, and prepare food yourself.  Tips for buying more food on a limited budget include buying generic brands, using coupons only for foods you normally buy, and buying healthy items from the bulk bins when available.  Tips for buying cheaper food to replace expensive food include choosing cheaper, lean cuts of  meat, and buying dried beans and peas. This information is not intended to replace advice given to you by your health care provider. Make sure you discuss any questions you have with your health care provider. Document Revised: 04/05/2017 Document Reviewed: 04/05/2017 Elsevier Patient Education  2020 [place] protect organs, store calcium, anchor muscles, and support the whole body. Keeping your bones strong is important, especially as you get older. You can take actions to help keep your bones strong and healthy. Why is keeping my bones healthy important?  Keeping your bones healthy is important because your body constantly replaces bone cells. Cells get old, and new cells take their place. As we age, we lose bone cells because the body may not be able to make enough new cells to replace the old cells. The amount of bone cells and bone tissue you have is referred to as bone mass. The higher your bone mass, the stronger your bones. The aging process leads to an overall loss of bone mass in the body, which can increase the likelihood of:  Joint pain and stiffness.  Broken bones.  A condition in which the bones become weak and brittle (osteoporosis). A large decline in bone mass occurs in older adults. In women, it occurs about the time of menopause. What actions can I take to keep my bones healthy? Good health habits are important for maintaining healthy bones. This includes eating nutritious foods and exercising regularly. To have healthy bones, you need to get enough of the right minerals and vitamins. Most nutrition experts recommend  getting these nutrients from the foods that you eat. In some cases, taking supplements may also be recommended. Doing certain types of exercise is also important for bone health. What are the nutritional recommendations for healthy bones?  Eating a well-balanced diet with plenty of calcium and vitamin D will help to protect your bones. Nutritional recommendations vary from person to person. Ask your health care provider what is healthy for you. Here are some general guidelines. Get enough calcium Calcium is the most important (essential) mineral for bone health. Most people can get enough calcium from their diet, but supplements may be recommended for people who are at risk for osteoporosis. Good sources of calcium include:  Dairy products, such as low-fat or nonfat milk, cheese, and yogurt.  Dark green leafy vegetables, such as bok choy and broccoli.  Calcium-fortified foods, such as orange juice, cereal, bread, soy beverages, and tofu products.  Nuts, such as almonds. Follow these recommended amounts for daily calcium intake:  Children, age 68-3: 700 mg.  Children, age 48-8: 1,000 mg.  Children, age 69-13: 1,300 mg.  Teens, age 74-18: 1,300 mg.  Adults, age 687-50: 1,000 mg.  Adults, age 30-70: ? Men: 1,000 mg. ? Women: 1,200 mg.  Adults, age 488 or older: 1,200 mg.  Pregnant and breastfeeding females: ? Teens: 1,300 mg. ? Adults: 1,000 mg. Get enough vitamin D Vitamin D is the most essential vitamin for bone health. It helps the body absorb calcium. Sunlight stimulates the skin to make vitamin D, so be sure to get enough sunlight. If you live in a cold climate or you do not get outside often, your health care provider may recommend that you take vitamin D supplements. Good sources of vitamin D in your diet include:  Egg yolks.  Saltwater fish.  Milk and cereal fortified with vitamin D. Follow these recommended amounts for daily vitamin D intake:  Children and teens,  age  16-18: 600 international units.  Adults, age 58 or younger: 400-800 international units.  Adults, age 29 or older: 800-1,000 international units. Get other important nutrients Other nutrients that are important for bone health include:  Phosphorus. This mineral is found in meat, poultry, dairy foods, nuts, and legumes. The recommended daily intake for adult men and adult women is 700 mg.  Magnesium. This mineral is found in seeds, nuts, dark green vegetables, and legumes. The recommended daily intake for adult men is 400-420 mg. For adult women, it is 310-320 mg.  Vitamin K. This vitamin is found in green leafy vegetables. The recommended daily intake is 120 mg for adult men and 90 mg for adult women. What type of physical activity is best for building and maintaining healthy bones? Weight-bearing and strength-building activities are important for building and maintaining healthy bones. Weight-bearing activities cause muscles and bones to work against gravity. Strength-building activities increase the strength of the muscles that support bones. Weight-bearing and muscle-building activities include:  Walking and hiking.  Jogging and running.  Dancing.  Gym exercises.  Lifting weights.  Tennis and racquetball.  Climbing stairs.  Aerobics. Adults should get at least 30 minutes of moderate physical activity on most days. Children should get at least 60 minutes of moderate physical activity on most days. Ask your health care provider what type of exercise is best for you. How can I find out if my bone mass is low? Bone mass can be measured with an X-ray test called a bone mineral density (BMD) test. This test is recommended for all women who are age 12 or older. It may also be recommended for:  Men who are age 43 or older.  People who are at risk for osteoporosis because of: ? Having bones that break easily. ? Having a long-term disease that weakens bones, such as kidney disease or  rheumatoid arthritis. ? Having menopause earlier than normal. ? Taking medicine that weakens bones, such as steroids, thyroid hormones, or hormone treatment for breast cancer or prostate cancer. ? Smoking. ? Drinking three or more alcoholic drinks a day. If you find that you have a low bone mass, you may be able to prevent osteoporosis or further bone loss by changing your diet and lifestyle. Where can I find more information? For more information, check out the following websites:  [place]: AviationTales.fr  [employer] of Health: www.bones.SouthExposed.es  International Osteoporosis Foundation: Administrator.iofbonehealth.org Summary  The aging process leads to an overall loss of bone mass in the body, which can increase the likelihood of broken bones and osteoporosis.  Eating a well-balanced diet with plenty of calcium and vitamin D will help to protect your bones.  Weight-bearing and strength-building activities are also important for building and maintaining strong bones.  Bone mass can be measured with an X-ray test called a bone mineral density (BMD) test. This information is not intended to replace advice given to you by your health care provider. Make sure you discuss any questions you have with your health care provider. Document Revised: 05/01/2017 Document Reviewed: 05/01/2017 Elsevier Patient Education  2020 [employer].

## 2020-01-15 ENCOUNTER — Encounter: Payer: Self-pay | Admitting: Obstetrics and Gynecology

## 2020-01-16 LAB — CBC WITH DIFFERENTIAL
Basophils Absolute: 0.1 10*3/uL (ref 0.0–0.2)
Basos: 1 %
EOS (ABSOLUTE): 0.1 10*3/uL (ref 0.0–0.4)
Eos: 1 %
Hematocrit: 39.6 % (ref 34.0–46.6)
Hemoglobin: 13.7 g/dL (ref 11.1–15.9)
Immature Grans (Abs): 0 10*3/uL (ref 0.0–0.1)
Immature Granulocytes: 0 %
Lymphocytes Absolute: 2.2 10*3/uL (ref 0.7–3.1)
Lymphs: 30 %
MCH: 32.9 pg (ref 26.6–33.0)
MCHC: 34.6 g/dL (ref 31.5–35.7)
MCV: 95 fL (ref 79–97)
Monocytes Absolute: 0.5 10*3/uL (ref 0.1–0.9)
Monocytes: 7 %
Neutrophils Absolute: 4.4 10*3/uL (ref 1.4–7.0)
Neutrophils: 61 %
RBC: 4.17 x10E6/uL (ref 3.77–5.28)
RDW: 12.5 % (ref 11.7–15.4)
WBC: 7.3 10*3/uL (ref 3.4–10.8)

## 2020-01-16 LAB — HSV(HERPES SMPLX)ABS-I+II(IGG+IGM)-BLD
HSV 1 Glycoprotein G Ab, IgG: 31.6 index — ABNORMAL HIGH (ref 0.00–0.90)
HSV 2 IgG, Type Spec: >23.6 index — ABNORMAL HIGH (ref 0.00–0.90)
HSVI/II Comb IgM: <0.91 Ratio (ref 0.00–0.90)

## 2020-01-16 LAB — CYTOLOGY - PAP
Chlamydia: NEGATIVE
Comment: NEGATIVE
Comment: NEGATIVE
Comment: NEGATIVE
Comment: NORMAL
Diagnosis: NEGATIVE
High risk HPV: NEGATIVE
Neisseria Gonorrhea: NEGATIVE
Trichomonas: NEGATIVE

## 2020-01-16 LAB — IRON AND TIBC
Iron Saturation: 37 % (ref 15–55)
Iron: 107 ug/dL (ref 27–159)
Total Iron Binding Capacity: 291 ug/dL (ref 250–450)
UIBC: 184 ug/dL (ref 131–425)

## 2020-01-16 LAB — HIV ANTIBODY (ROUTINE TESTING W REFLEX): HIV Screen 4th Generation wRfx: NONREACTIVE

## 2020-01-16 LAB — HEPATITIS PANEL, ACUTE
Hep A IgM: NEGATIVE
Hep B C IgM: NEGATIVE
Hep C Virus Ab: <0.1 s/co ratio (ref 0.0–0.9)
Hepatitis B Surface Ag: NEGATIVE

## 2020-01-16 LAB — RPR: RPR Ser Ql: NONREACTIVE

## 2020-01-16 LAB — VITAMIN D 25 HYDROXY (VIT D DEFICIENCY, FRACTURES): Vit D, 25-Hydroxy: 27.3 ng/mL — ABNORMAL LOW (ref 30.0–100.0)

## 2020-01-16 LAB — TSH+FREE T4
Free T4: 1.17 ng/dL (ref 0.82–1.77)
TSH: 1.11 u[IU]/mL (ref 0.450–4.500)

## 2020-01-16 LAB — FERRITIN: Ferritin: 104 ng/mL (ref 15–150)

## 2020-01-28 ENCOUNTER — Other Ambulatory Visit: Payer: Self-pay

## 2020-01-28 ENCOUNTER — Encounter: Payer: Self-pay | Admitting: Obstetrics and Gynecology

## 2020-01-28 ENCOUNTER — Ambulatory Visit (INDEPENDENT_AMBULATORY_CARE_PROVIDER_SITE_OTHER): Payer: Medicaid Other | Admitting: Obstetrics and Gynecology

## 2020-01-28 ENCOUNTER — Ambulatory Visit (INDEPENDENT_AMBULATORY_CARE_PROVIDER_SITE_OTHER): Payer: Medicaid Other

## 2020-01-28 VITALS — BP 114/70 | Ht 67.0 in | Wt 219.6 lb

## 2020-01-28 DIAGNOSIS — N920 Excessive and frequent menstruation with regular cycle: Secondary | ICD-10-CM | POA: Diagnosis not present

## 2020-01-28 DIAGNOSIS — Z975 Presence of (intrauterine) contraceptive device: Secondary | ICD-10-CM | POA: Diagnosis not present

## 2020-01-28 DIAGNOSIS — N921 Excessive and frequent menstruation with irregular cycle: Secondary | ICD-10-CM

## 2020-01-28 DIAGNOSIS — N852 Hypertrophy of uterus: Secondary | ICD-10-CM | POA: Diagnosis not present

## 2020-01-28 MED ORDER — NORETHINDRONE ACET-ETHINYL EST 1.5-30 MG-MCG PO TABS: 1.0000 | ORAL_TABLET | Freq: Every day | ORAL | 0 refills | Status: DC
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
## 2020-02-17 ENCOUNTER — Other Ambulatory Visit: Payer: Self-pay | Admitting: Obstetrics and Gynecology

## 2020-02-17 DIAGNOSIS — Z1239 Encounter for other screening for malignant neoplasm of breast: Secondary | ICD-10-CM

## 2020-03-27 ENCOUNTER — Ambulatory Visit
Admission: RE | Admit: 2020-03-27 | Discharge: 2020-03-27 | Disposition: A | Discharge status: Home / Self Care | Payer: Medicaid Other | Source: Ambulatory Visit | Attending: Obstetrics and Gynecology | Admitting: Obstetrics and Gynecology

## 2020-03-27 ENCOUNTER — Other Ambulatory Visit: Payer: Self-pay

## 2020-03-27 DIAGNOSIS — Z1231 Encounter for screening mammogram for malignant neoplasm of breast: Secondary | ICD-10-CM | POA: Insufficient documentation

## 2020-03-27 DIAGNOSIS — Z1239 Encounter for other screening for malignant neoplasm of breast: Secondary | ICD-10-CM

## 2020-04-01 ENCOUNTER — Other Ambulatory Visit: Payer: Self-pay

## 2020-04-01 ENCOUNTER — Encounter: Payer: Self-pay | Admitting: *Deleted

## 2020-04-01 ENCOUNTER — Emergency Department: Payer: Medicaid Other

## 2020-04-01 ENCOUNTER — Emergency Department
Admission: EM | Admit: 2020-04-01 | Discharge: 2020-04-01 | Disposition: A | Discharge status: Home / Self Care | Payer: Medicaid Other | Attending: Emergency Medicine | Admitting: Emergency Medicine

## 2020-04-01 DIAGNOSIS — M25511 Pain in right shoulder: Secondary | ICD-10-CM | POA: Insufficient documentation

## 2020-04-01 MED ORDER — KETOROLAC TROMETHAMINE 10 MG PO TABS: 10.0000 mg | ORAL_TABLET | Freq: Three times a day (TID) | ORAL | 0 refills | Status: DC

## 2020-04-01 MED ORDER — CYCLOBENZAPRINE HCL 5 MG PO TABS: 5.0000 mg | ORAL_TABLET | Freq: Three times a day (TID) | ORAL | 0 refills | Status: AC | PRN

## 2020-04-01 MED ORDER — LIDOCAINE 5 % EX PTCH: 1.0000 | MEDICATED_PATCH | Freq: Two times a day (BID) | CUTANEOUS | 0 refills | Status: AC | PRN

## 2020-04-01 MED ORDER — KETOROLAC TROMETHAMINE 30 MG/ML IJ SOLN
30.0000 mg | Freq: Once | INTRAMUSCULAR | Status: AC
  Administered 2020-04-01: 30 mg via INTRAMUSCULAR
  Filled 2020-04-01: qty 1

## 2020-04-01 NOTE — ED Triage Notes (Signed)
Pt to ED reporting right sided shoulder pain x 2 days. Pt thought originally that she had slept on her shoulder wrong ut the pain has just been worsening today. Pt reporting pain has eased up with icing and propping her shoulder up on a pillow. Decreased mobility, pt reporting she feels as though she can not lift her arm. No known injury.
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
## 2020-04-29 ENCOUNTER — Other Ambulatory Visit: Payer: Self-pay

## 2020-04-29 ENCOUNTER — Encounter: Payer: Self-pay | Admitting: Obstetrics and Gynecology

## 2020-04-29 ENCOUNTER — Ambulatory Visit (INDEPENDENT_AMBULATORY_CARE_PROVIDER_SITE_OTHER): Payer: Medicare Other | Admitting: Obstetrics and Gynecology

## 2020-04-29 DIAGNOSIS — Z975 Presence of (intrauterine) contraceptive device: Secondary | ICD-10-CM | POA: Diagnosis not present

## 2020-04-29 DIAGNOSIS — N921 Excessive and frequent menstruation with irregular cycle: Secondary | ICD-10-CM | POA: Diagnosis not present

## 2020-04-29 MED ORDER — NORETHINDRONE ACET-ETHINYL EST 1.5-30 MG-MCG PO TABS: 1.0000 | ORAL_TABLET | Freq: Every day | ORAL | 4 refills | Status: DC

## 2020-04-29 NOTE — Progress Notes (Signed)
Pt would like to consult about her birthcontrol.
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
## 2020-04-29 NOTE — Patient Instructions (Signed)
Hormonal Contraception Information Hormonal contraception is a type of birth control that uses hormones to prevent pregnancy. It usually involves a combination of the hormones estrogen and progesterone, or only the hormone progesterone. Hormonal contraception works in these ways:  It thickens the mucus in the cervix, which is the lowest part of the uterus. Thicker mucus makes it harder for sperm to enter the uterus.  It changes the lining of the uterus. This makes it harder for an egg to attach or implant.  It may stop the ovaries from releasing eggs (ovulation). Some women who take hormonal contraceptives that contain only progesterone may continue to ovulate. Hormonal contraception cannot prevent STIs (sexually transmitted infections). Pregnancy may still occur. Types of hormonal contraception Estrogen and progesterone contraceptives Contraceptives that use a combination of estrogen and progesterone are available in these forms:  Pill. Pills come in different combinations of hormones. Pills must be taken at the same time each day. They can affect your period. You can get your period monthly, once every 3 months, or not at all.  Patch. The patch is applied to the buttocks, abdomen, upper outer arm, or back. It is kept in place for 3 weeks. It is removed for the last or fourth week of the cycle.  Vaginal ring. The ring is placed in the vagina and left there for 3 weeks. It is then removed for the last or fourth week of the cycle.   Progesterone-only contraceptives Contraceptives that use only progesterone are available in these forms:  Pill. Pills should be taken at the same time everyday. This is very important to decrease the chance of pregnancy. Pills containing progestin-only are usually taken every day of the cycle. Other types of pills may have a placebo tablet for the last 4 days of every cycle.  Intrauterine device (IUD). This device is inserted through the vagina and cervix into the  uterus. It is removed or replaced every 3 to 5 years, depending on the type. It can be removed sooner.  Implant. Plastic rods are placed under the skin of the upper arm. They are removed or replaced every 3 years. They can be removed sooner.  Shot (injection). The injection is given once every 12 or 13 weeks (about 3 months).   Risks associated with hormonal contraception Estrogen and progesterone contraceptives can sometimes cause side effects, such as:  Nausea.  Headaches.  Breast tenderness.  Bleeding or spotting between menstrual cycles.  High blood pressure (rare).  Strokes, heart attacks, or blood clots (rare). Progesterone-only contraceptives also can have side effects, such as:  Nausea.  Headaches.  Breast tenderness.  Irregular menstrual bleeding.  High blood pressure (rare). Talk to your health care provider about what side effects may mean for you. Questions to ask:  What type of hormonal contraception is right for me?  How long should I plan to use hormonal contraception?  What are the side effects of the hormonal contraception method I choose?  How can I prevent STIs while using hormonal contraception? Where to find more information Ask your health care provider for more information and resources about hormonal contraception. You can also go to:  [place] Department of Health and [employer], Office on Women's Health: [place] Summary  Estrogen and progesterone are hormones used in many forms of birth control.  Hormonal contraception cannot prevent STIs (sexually transmitted infections).  Talk to your health care provider about what side effects may mean for you.  Ask your health care provider for more information and  resources about hormonal contraception. This information is not intended to replace advice given to you by your health care provider. Make sure you discuss any questions you have with your health care provider. Document  Revised: 12/11/2019 Document Reviewed: 12/11/2019 Elsevier Patient Education  2021 [employer].

## 2020-05-20 ENCOUNTER — Other Ambulatory Visit: Payer: Self-pay

## 2020-05-20 ENCOUNTER — Ambulatory Visit (INDEPENDENT_AMBULATORY_CARE_PROVIDER_SITE_OTHER): Payer: Medicare Other | Admitting: Dermatology

## 2020-05-20 DIAGNOSIS — L659 Nonscarring hair loss, unspecified: Secondary | ICD-10-CM | POA: Diagnosis not present

## 2020-05-20 DIAGNOSIS — D1723 Benign lipomatous neoplasm of skin and subcutaneous tissue of right leg: Secondary | ICD-10-CM | POA: Diagnosis not present

## 2020-05-20 DIAGNOSIS — L680 Hirsutism: Secondary | ICD-10-CM

## 2020-05-20 NOTE — Progress Notes (Signed)
   New Patient Visit  Subjective  [name] is a 40 y.o. female who presents for the following: Alopecia (Patient here today for hair loss. Started within the last 6-7 months, she is taking Biotin and Vitamin E. No recent illnesses or surgeries. Also has a cyst at right thigh, present for at least 1 year. It has gotten larger, has not drained. Patient would like to talk about the extra hair growth under chin. It has been present for years and is bothersome. She does get waxed and uses vaseline and vitamin E. ).  The following portions of the chart were reviewed this encounter and updated as appropriate:   Tobacco  Allergies  Meds  Problems  Med Hx  Surg Hx  Fam Hx      Review of Systems:  No other skin or systemic complaints except as noted in HPI or Assessment and Plan.  Objective  Well appearing patient in no apparent distress; mood and affect are within normal limits.  A focused examination was performed including face, scalp, neck, right thigh. Relevant physical exam findings are noted in the Assessment and Plan.  Objective  Right Medial Thigh: 1.2cm soft subcutaneous nodule  Objective  underside chin: Terminal hair growth beard distribution  Objective  Scalp: Diffuse slight thinning Unable to do hair pull test due to braids   Assessment & Plan  Lipoma of right lower extremity Right Medial Thigh  Discussed excision, including resulting scar.    Hirsutism underside chin  Chronic, flared  Will refer to endocrinology for evaluation.  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   Reviewed CBC with diff, TSH, T4, vitamin D, iron studies.   As of September 2021 Pt was Vitamin D deficient and she has not been taking vitamin D regularly since that time. Recommend starting Vitamin D   Start 1000 IU Vitamin D daily.   Return in 4  months (on 09/17/2020) for alopecia follow up, Surgery.  Graciella Belton, RMA, am acting as scribe for Forest Gleason, MD .  Documentation: I have reviewed the above documentation for accuracy and completeness, and I agree with the above.  Forest Gleason, MD

## 2020-05-20 NOTE — Patient Instructions (Addendum)
Pre-Operative Instructions  You are scheduled for a surgical procedure at [facility]. We recommend you read the following instructions. If you have any questions or concerns, please call the office at [phone].  1. Shower and wash the entire body with soap and water the day of your surgery paying special attention to cleansing at and around the planned surgery site.  2. Avoid aspirin or aspirin containing products at least fourteen (14) days prior to your surgical procedure and for at least one week (7 Days) after your surgical procedure. If you take aspirin on a regular basis for heart disease or history of stroke or for any other reason, we may recommend you continue taking aspirin but please notify us if you take this on a regular basis. Aspirin can cause more bleeding to occur during surgery as well as prolonged bleeding and bruising after surgery.   3. Avoid other nonsteroidal pain medications at least one week prior to surgery and at least one week prior to your surgery. These include medications such as Ibuprofen (Motrin, Advil and Nuprin), Naprosyn, Voltaren, Relafen, etc. If medications are used for therapeutic reasons, please inform us as they can cause increased bleeding or prolonged bleeding during and bruising after surgical procedures.   4. Please advise [place] if you are taking any "blood thinner" medications such as Coumadin or Dipyridamole or Plavix or similar medications. These cause increased bleeding and prolonged bleeding during procedures and bruising after surgical procedures. We may have to consider discontinuing these medications briefly prior to and shortly after your surgery if safe to do so.   5. Please inform us of all medications you are currently taking. All medications that are taken regularly should be taken the day of surgery as you always do. Nevertheless, we need to be informed of what medications you are taking prior to surgery to know whether they will  affect the procedure or cause any complications.   6. Please inform us of any medication allergies. Also inform us of whether you have allergies to Latex or rubber products or whether you have had any adverse reaction to Lidocaine or Epinephrine.  7. Please inform us of any prosthetic or artificial body parts such as artificial heart valve, joint replacements, etc., or similar condition that might require preoperative antibiotics.   8. We recommend avoidance of alcohol at least two weeks prior to surgery and continued avoidance for at least two weeks after surgery.   9. We recommend discontinuation of tobacco smoking at least two weeks prior to surgery and continued abstinence for at least two weeks after surgery.  10. Do not plan strenuous exercise, strenuous work or strenuous lifting for approximately four weeks after your surgery.   11. We request if you are unable to make your scheduled surgical appointment, please call us at least a week in advance or as soon as you are aware of a problem so that we can cancel or reschedule the appointment.   12. You MAY TAKE TYLENOL (acetaminophen) for pain as it is not a blood thinner.   13. PLEASE PLAN TO BE IN TOWN FOR TWO WEEKS FOLLOWING SURGERY, THIS IS IMPORTANT SO YOU CAN BE CHECKED FOR DRESSING CHANGES, SUTURE REMOVAL AND TO MONITOR FOR POSSIBLE COMPLICATIONS.   Start Vitamin D 1000 IU daily.   For laser hair removal, Call [facility] and [place] [phone] and/or [facility] [phone] and/or [facility] [phone] and ask if they have the right laser to do  laser hair removal for type 5 skin.

## 2020-05-21 ENCOUNTER — Other Ambulatory Visit: Payer: Self-pay

## 2020-05-21 ENCOUNTER — Encounter: Payer: Self-pay | Admitting: Dermatology

## 2020-05-21 DIAGNOSIS — L680 Hirsutism: Secondary | ICD-10-CM

## 2020-05-21 MED ORDER — EFLORNITHINE HCL 13.9 % EX CREA: TOPICAL_CREAM | CUTANEOUS | 11 refills | Status: DC

## 2020-05-21 NOTE — Progress Notes (Unsigned)
amb  

## 2020-05-25 ENCOUNTER — Other Ambulatory Visit: Payer: Self-pay

## 2020-05-25 DIAGNOSIS — N921 Excessive and frequent menstruation with irregular cycle: Secondary | ICD-10-CM

## 2020-05-25 MED ORDER — NORETHINDRONE ACET-ETHINYL EST 1.5-30 MG-MCG PO TABS: 1.0000 | ORAL_TABLET | Freq: Every day | ORAL | 4 refills | Status: DC

## 2020-05-25 NOTE — Telephone Encounter (Signed)
Pt calling; rx for bcp needs to be sent to [facility].  [phone]  Pt aware rx sent to [facility].

## 2020-07-07 ENCOUNTER — Other Ambulatory Visit: Payer: Self-pay

## 2020-07-07 ENCOUNTER — Ambulatory Visit (INDEPENDENT_AMBULATORY_CARE_PROVIDER_SITE_OTHER): Payer: Medicare Other | Admitting: Dermatology

## 2020-07-07 ENCOUNTER — Other Ambulatory Visit: Payer: Self-pay | Admitting: Dermatology

## 2020-07-07 DIAGNOSIS — L680 Hirsutism: Secondary | ICD-10-CM

## 2020-07-07 DIAGNOSIS — D485 Neoplasm of uncertain behavior of skin: Secondary | ICD-10-CM

## 2020-07-07 MED ORDER — EFLORNITHINE HCL 13.9 % EX CREA: TOPICAL_CREAM | CUTANEOUS | 2 refills | Status: DC

## 2020-07-07 MED ORDER — MUPIROCIN 2 % EX OINT: 1.0000 "application " | TOPICAL_OINTMENT | Freq: Every day | CUTANEOUS | 0 refills | Status: DC

## 2020-07-07 NOTE — Patient Instructions (Signed)
If you have any questions or concerns for your doctor, please call our main line at [phone] and press option 4 to reach your doctor's medical assistant. If no one answers, please leave a voicemail as directed and we will return your call as soon as possible. Messages left after 4 pm will be answered the following business day.   You may also send us a message via MyChart. We typically respond to MyChart messages within 1-2 business days.  For prescription refills, please ask your pharmacy to contact our office. Our fax number is [phone].  If you have an urgent issue when the clinic is closed that cannot wait until the next business day, you can page your doctor at the number below.    Please note that while we do our best to be available for urgent issues outside of office hours, we are not available 24/7.   If you have an urgent issue and are unable to reach us, you may choose to seek medical care at your doctor's office, retail clinic, urgent care center, or emergency room.  If you have a medical emergency, please immediately call 911 or go to the emergency department.  Pager Numbers  - Dr. Kowalski: [phone]  - Dr. Moye: [phone]  - Dr. Stewart: [phone]  In the event of inclement weather, please call our main line at [phone] for an update on the status of any delays or closures.  Dermatology Medication Tips: Please keep the boxes that topical medications come in in order to help keep track of the instructions about where and how to use these. Pharmacies typically print the medication instructions only on the boxes and not directly on the medication tubes.   If your medication is too expensive, please contact our office at [phone] option 4 or send us a message through MyChart.   We are unable to tell what your co-pay for medications will be in advance as this is different depending on your insurance coverage. However, we may be able to find a  substitute medication at lower cost or fill out paperwork to get insurance to cover a needed medication.   If a prior authorization is required to get your medication covered by your insurance company, please allow us 1-2 business days to complete this process.  Drug prices often vary depending on where the prescription is filled and some pharmacies may offer cheaper prices.  The website www.goodrx.com contains coupons for medications through different pharmacies. The prices here do not account for what the cost may be with help from insurance (it may be cheaper with your insurance), but the website can give you the price if you did not use any insurance.  - You can print the associated coupon and take it with your prescription to the pharmacy.  - You may also stop by our office during regular business hours and pick up a GoodRx coupon card.  - If you need your prescription sent electronically to a different pharmacy, notify our office through [place] MyChart or by phone at [phone] option 4.     Wound Care Instructions  1. Cleanse wound gently with soap and water once a day then pat dry with clean gauze. Apply a thing coat of Petrolatum (petroleum jelly, "Vaseline") over the wound (unless you have an allergy to this). We recommend that you use a new, sterile tube of Vaseline. Do not pick or remove scabs. Do not remove the yellow or white "healing tissue" from the base of the wound.    2. Cover the wound with fresh, clean, nonstick gauze and secure with paper tape. You may use Band-Aids in place of gauze and tape if the would is small enough, but would recommend trimming much of the tape off as there is often too much. Sometimes Band-Aids can irritate the skin.  3. You should call the office for your biopsy report after 1 week if you have not already been contacted.  4. If you experience any problems, such as abnormal amounts of bleeding, swelling, significant bruising, significant pain,  or evidence of infection, please call the office immediately.  5. FOR ADULT SURGERY PATIENTS: If you need something for pain relief you may take 1 extra strength Tylenol (acetaminophen) AND 2 Ibuprofen (200mg each) together every 4 hours as needed for pain. (do not take these if you are allergic to them or if you have a reason you should not take them.) Typically, you may only need pain medication for 1 to 3 days.     

## 2020-07-07 NOTE — Progress Notes (Signed)
   Follow-Up Visit   Subjective  [name] is a 40 y.o. female who presents for the following: Procedure (Patient here today for excision of lipoma at right medial thigh. ).    The following portions of the chart were reviewed this encounter and updated as appropriate:   Tobacco  Allergies  Meds  Problems  Med Hx  Surg Hx  Fam Hx      Review of Systems:  No other skin or systemic complaints except as noted in HPI or Assessment and Plan.  Objective  Well appearing patient in no apparent distress; mood and affect are within normal limits.  A focused examination was performed including right thigh. Relevant physical exam findings are noted in the Assessment and Plan.  Objective  Right Medial Thigh: 1.2cm soft subcutaneous nodule   Assessment & Plan  Neoplasm of uncertain behavior of skin Right Medial Thigh  Skin excision  Lesion length (cm):  1.6 Informed consent: discussed and consent obtained   Timeout: patient name, date of birth, surgical site, and procedure verified   Procedure prep:  Patient was prepped and draped in usual sterile fashion Prep type:  Chlorhexidine Anesthesia: the lesion was anesthetized in a standard fashion   Anesthesia comment:  11cc Anesthetic:  1% lidocaine w/ epinephrine 1-100,000 buffered w/ 8.4% NaHCO3 Instrument used comment:  15c Hemostasis achieved with: suture, pressure and electrodesiccation   Outcome: patient tolerated procedure well with no complications   Post-procedure details: wound care instructions given   Additional details:  Mupirocin and a pressure dressing applied  Skin repair Complexity:  Intermediate Informed consent: discussed and consent obtained   Timeout: patient name, date of birth, surgical site, and procedure verified   Procedure prep:  Patient was prepped and draped in usual sterile fashion Prep type:  Chlorhexidine Anesthesia: the lesion was anesthetized in a standard fashion   Anesthetic:  1%  lidocaine w/ epinephrine 1-100,000 local infiltration Reason for type of repair: reduce tension to allow closure, reduce the risk of dehiscence, infection, and necrosis and enhance both functionality and cosmetic results   Undermining: edges undermined   Subcutaneous layers (deep stitches):  Suture type: Vicryl (polyglactin 910)   Stitches:  Buried vertical mattress Fine/surface layer approximation (top stitches):  Hemostasis achieved with: suture, pressure and electrodesiccation Outcome: patient tolerated procedure well with no complications   Post-procedure details: wound care instructions given   Additional details:  Mupirocin and a pressure dressing applied  Specimen 1 - Surgical pathology Differential Diagnosis:r/o Lipoma vs Other  Check Margins: No 1.2cm soft subcutaneous nodule  Return in about 1 week (around 07/14/2020) for Suture Removal.   I, Margarette Asal, RMA, scribed for Alfonso Patten, MD.  Documentation: I have reviewed the above documentation for accuracy and completeness, and I agree with the above.  Forest Gleason, MD

## 2020-07-07 NOTE — Progress Notes (Unsigned)
va

## 2020-07-08 ENCOUNTER — Encounter: Payer: Self-pay | Admitting: Dermatology

## 2020-07-08 ENCOUNTER — Telehealth: Payer: Self-pay

## 2020-07-08 NOTE — Telephone Encounter (Signed)
Called patient to check on them following yesterday's surgery, no answer, left msg, JS

## 2020-07-09 ENCOUNTER — Encounter: Payer: Self-pay | Admitting: Dermatology

## 2020-07-09 ENCOUNTER — Other Ambulatory Visit: Payer: Self-pay

## 2020-07-09 ENCOUNTER — Ambulatory Visit (INDEPENDENT_AMBULATORY_CARE_PROVIDER_SITE_OTHER): Payer: Medicare Other

## 2020-07-09 DIAGNOSIS — D485 Neoplasm of uncertain behavior of skin: Secondary | ICD-10-CM

## 2020-07-09 MED ORDER — DOXYCYCLINE MONOHYDRATE 100 MG PO CAPS: 100.0000 mg | ORAL_CAPSULE | Freq: Two times a day (BID) | ORAL | 0 refills | Status: DC

## 2020-07-09 NOTE — Patient Instructions (Addendum)

## 2020-07-09 NOTE — Progress Notes (Signed)
Patient came in today for dressing change.  Patient has erythema to the right thigh. Patient used different band aid from home that has caused skin tears. Patient advised to clean wound, apply mupirocin ointment, clean telfa and brown medical tape to outside area.   Doxycycline Mono 100mg  BID for 7 days sent in per Dr. Laurence Ferrari.   Patient advised to page Dr. Laurence Ferrari if needed for the weekend.

## 2020-07-14 ENCOUNTER — Other Ambulatory Visit: Payer: Self-pay

## 2020-07-14 ENCOUNTER — Ambulatory Visit (INDEPENDENT_AMBULATORY_CARE_PROVIDER_SITE_OTHER): Payer: Medicare Other | Admitting: Dermatology

## 2020-07-14 DIAGNOSIS — Z4802 Encounter for removal of sutures: Secondary | ICD-10-CM

## 2020-07-14 MED ORDER — DOXYCYCLINE MONOHYDRATE 100 MG PO CAPS: 100.0000 mg | ORAL_CAPSULE | Freq: Two times a day (BID) | ORAL | 0 refills | Status: DC

## 2020-07-14 NOTE — Patient Instructions (Signed)

## 2020-07-14 NOTE — Progress Notes (Signed)
   Follow-Up Visit   Subjective  [name] is a 40 y.o. female who presents for the following: Suture / Staple Removal (Suture removal post excision of biopsy proven lipoma. ).  The following portions of the chart were reviewed this encounter and updated as appropriate:  Tobacco  Allergies  Meds  Problems  Med Hx  Surg Hx  Fam Hx      Review of Systems: No other skin or systemic complaints except as noted in HPI or Assessment and Plan.   Objective  Well appearing patient in no apparent distress; mood and affect are within normal limits.  A focused examination was performed including right medial thigh. Relevant physical exam findings are noted in the Assessment and Plan.  Objective  Right Medial Thigh: Well healing scar post excision for biopsy proven lipoma.   Assessment & Plan  Encounter for removal of sutures Right Medial Thigh  Encounter for Removal of Sutures - Incision site at the right medial thigh is clean, dry and intact - Wound cleansed, sutures removed, wound cleansed and steri strips applied.  - Discussed pathology results showing lipoma.  - Patient advised to keep steri-strips dry until they fall off. - Scars remodel for a full year. - Once steri-strips fall off, patient can apply over-the-counter silicone scar cream each night to help with scar remodeling if desired. - Patient advised to call with any concerns or if they notice any new or changing lesions.   Continue Doxycycline 100 mg BID x another 7 days.   Advised pt to cover area with band aid after steri strips come off if area is bothered by rubbing from pants.   Also with erosions secondary to tear from bandage - Improving. Steristrips applied to this area as well given difficulty of applying ointment and keeping covered. If not completely healed when steristrips remove, she will restart mupirocin ointment to them 3 times per day until healed.  Return if symptoms worsen or fail to improve.    I, Harriett Sine, CMA, am acting as scribe for Forest Gleason, MD.  Documentation: I have reviewed the above documentation for accuracy and completeness, and I agree with the above.  Forest Gleason, MD

## 2020-07-15 ENCOUNTER — Encounter: Payer: Self-pay | Admitting: Dermatology

## 2020-07-24 ENCOUNTER — Telehealth: Payer: Self-pay | Admitting: Dermatology

## 2020-07-24 MED ORDER — GENTAMICIN SULFATE 0.1 % EX OINT: 1.0000 "application " | TOPICAL_OINTMENT | Freq: Three times a day (TID) | CUTANEOUS | 1 refills | Status: DC

## 2020-07-24 NOTE — Telephone Encounter (Signed)
Patient with open wound at surgical site after removing steristrips. She did not start doxycycline after last visit. Based on photo, appears open wound limited to area that was violaceous right after surgery.   Will have her restart mupirocin and add gentamicin ointment (sent in to pharmacy). Pt advised to cleanse area with hypochlorous acid or soap and water daily and apply ointments three times per day. Also advised to keep area covered as much as possible and to avoid letting area dry out.    Will check in on progress next week. She will page me or seek medical care right away if she develops any worsening, increase in size of ulcer, pain, or spreading redness.

## 2020-07-28 ENCOUNTER — Encounter: Payer: Self-pay | Admitting: Dermatology

## 2020-07-30 ENCOUNTER — Encounter: Payer: Self-pay | Admitting: Dermatology

## 2020-07-30 ENCOUNTER — Other Ambulatory Visit: Payer: Self-pay

## 2020-07-30 ENCOUNTER — Ambulatory Visit (INDEPENDENT_AMBULATORY_CARE_PROVIDER_SITE_OTHER): Payer: Medicare Other | Admitting: Dermatology

## 2020-07-30 DIAGNOSIS — S71101A Unspecified open wound, right thigh, initial encounter: Secondary | ICD-10-CM

## 2020-07-30 DIAGNOSIS — D485 Neoplasm of uncertain behavior of skin: Secondary | ICD-10-CM

## 2020-07-30 DIAGNOSIS — T148XXA Other injury of unspecified body region, initial encounter: Secondary | ICD-10-CM

## 2020-07-30 MED ORDER — DOXYCYCLINE MONOHYDRATE 100 MG PO CAPS: 100.0000 mg | ORAL_CAPSULE | Freq: Two times a day (BID) | ORAL | 0 refills | Status: DC

## 2020-07-30 MED ORDER — MUPIROCIN 2 % EX OINT: 1.0000 "application " | TOPICAL_OINTMENT | Freq: Every day | CUTANEOUS | 2 refills | Status: DC

## 2020-07-30 MED ORDER — GENTAMICIN SULFATE 0.1 % EX OINT: 1.0000 "application " | TOPICAL_OINTMENT | Freq: Three times a day (TID) | CUTANEOUS | 2 refills | Status: DC

## 2020-07-30 NOTE — Progress Notes (Addendum)
   Follow-Up Visit   Subjective  [name] is a 40 y.o. female who presents for the following: Wound Check (Patient states she had a reaction after area was removed on right medial thigh. Patient states she feels she had a burned reaction to maybe the numbing medication. Patient was prescribed doxycycline 100 mg by mouth twice daily she reports she is still taking and only has 3 capsules left. Patient is also using a cream which she states doesn't feel is helping. ).   The following portions of the chart were reviewed this encounter and updated as appropriate:  Tobacco  Allergies  Meds  Problems  Med Hx  Surg Hx  Fam Hx      Objective  Well appearing patient in no apparent distress; mood and affect are within normal limits.  A focused examination was performed including right medial thigh. Relevant physical exam findings are noted in the Assessment and Plan.  Objective  Right Medial Thigh: 4.7 x 3.2 cm width and length, depth 2 mm  Assessment & Plan  Neoplasm of uncertain behavior of skin  Open wound Right Medial Thigh  Culture performed today  Continue Gentamicin ointment 0.1 % and mupirocin ointment 2% apply three times daily. [facility]   Continue taking Doxycycline 100 mg twice a day with food for 7 days. [facility]   Start hypochlorous acid wound spray (available at [facility]) - she said they were out over the weekend so she didn't start it  Will submit for Allevyn silver dressing through [employer] to see if we can get it covered  If no infection on culture, may consider biopsy to evaluate for infection or see if consistent with Pyoderma Gangrenosum  Other Related Procedures Specimen status report Anaerobic and Aerobic Culture  Reordered Medications gentamicin ointment (GARAMYCIN) 0.1 % mupirocin ointment (BACTROBAN) 2 % doxycycline (MONODOX) 100 MG capsule  Return in about 1 week (around 08/06/2020) for follow up on wound .  I,  Ruthell Rummage, CMA, am acting as scribe for Forest Gleason, MD.  Documentation: I have reviewed the above documentation for accuracy and completeness, and I agree with the above.  Forest Gleason, MD

## 2020-07-30 NOTE — Patient Instructions (Addendum)
Doxycycline should be taken with food to prevent nausea. Do not lay down for 30 minutes after taking. Be cautious with sun exposure and use good sun protection while on this medication. Pregnant women should not take this medication.   Wound Care Instructions  1. Cleanse wound gently with soap and water once a day then pat dry with clean gauze. Apply a thing coat of Petrolatum (petroleum jelly, "Vaseline") over the wound (unless you have an allergy to this). We recommend that you use a new, sterile tube of Vaseline. Do not pick or remove scabs. Do not remove the yellow or white "healing tissue" from the base of the wound.  2. Cover the wound with fresh, clean, nonstick gauze and secure with paper tape. You may use Band-Aids in place of gauze and tape if the would is small enough, but would recommend trimming much of the tape off as there is often too much. Sometimes Band-Aids can irritate the skin.  3. You should call the office for your biopsy report after 1 week if you have not already been contacted.  4. If you experience any problems, such as abnormal amounts of bleeding, swelling, significant bruising, significant pain, or evidence of infection, please call the office immediately.  5. FOR ADULT SURGERY PATIENTS: If you need something for pain relief you may take 1 extra strength Tylenol (acetaminophen) AND 2 Ibuprofen (200mg  each) together every 4 hours as needed for pain. (do not take these if you are allergic to them or if you have a reason you should not take them.) Typically, you may only need pain medication for 1 to 3 days.   If you have any questions or concerns for your doctor, please call our main line at [phone] and press option 4 to reach your doctor's medical assistant. If no one answers, please leave a voicemail as directed and we will return your call as soon as possible. Messages left after 4 pm will be answered the following business day.   You may also send [place] a message via  [place]. We typically respond to MyChart messages within 1-2 business days.  For prescription refills, please ask your pharmacy to contact our office. Our fax number is [phone].  If you have an urgent issue when the clinic is closed that cannot wait until the next business day, you can page your doctor at the number below.    Please note that while we do our best to be available for urgent issues outside of office hours, we are not available 24/7.   If you have an urgent issue and are unable to reach [place], you may choose to seek medical care at your doctor's office, retail clinic, urgent care center, or emergency room.  If you have a medical emergency, please immediately call 911 or go to the emergency department.  Pager Numbers  - Dr. Nehemiah Massed: [phone]  - Dr. Laurence Ferrari: [phone]  - Dr. Nicole Kindred: [phone]  In the event of inclement weather, please call our main line at [phone] for an update on the status of any delays or closures.  Dermatology Medication Tips: Please keep the boxes that topical medications come in in order to help keep track of the instructions about where and how to use these. Pharmacies typically print the medication instructions only on the boxes and not directly on the medication tubes.   If your medication is too expensive, please contact our office at [phone] option 4 or send [place] a message through [place].   We  are unable to tell what your co-pay for medications will be in advance as this is different depending on your insurance coverage. However, we may be able to find a substitute medication at lower cost or fill out paperwork to get insurance to cover a needed medication.   If a prior authorization is required to get your medication covered by your insurance company, please allow [place] 1-2 business days to complete this process.  Drug prices often vary depending on where the prescription is filled and some pharmacies may offer cheaper  prices.  The website www.goodrx.com contains coupons for medications through different pharmacies. The prices here do not account for what the cost may be with help from insurance (it may be cheaper with your insurance), but the website can give you the price if you did not use any insurance.  - You can print the associated coupon and take it with your prescription to the pharmacy.  - You may also stop by our office during regular business hours and pick up a GoodRx coupon card.  - If you need your prescription sent electronically to a different pharmacy, notify our office through [facility] or by phone at [phone] option 4.

## 2020-08-01 LAB — SPECIMEN STATUS REPORT

## 2020-08-02 ENCOUNTER — Other Ambulatory Visit: Payer: Self-pay

## 2020-08-02 DIAGNOSIS — Z48817 Encounter for surgical aftercare following surgery on the skin and subcutaneous tissue: Secondary | ICD-10-CM | POA: Insufficient documentation

## 2020-08-02 NOTE — ED Triage Notes (Signed)
Fatty tissue removed R thigh 3/21 [facility] (is on 2nd round of doxy 100- , progressing to hole with black/yellow tissue softball size. States doc prescribed  gentamicin and mupirocin creams and pt states it just 'gets mushy, discharges and odor comes. I think its infected and I need 2nd opinion. Denies fevers

## 2020-08-03 ENCOUNTER — Emergency Department
Admission: EM | Admit: 2020-08-03 | Discharge: 2020-08-03 | Disposition: A | Discharge status: Home / Self Care | Payer: Medicare Other | Attending: Emergency Medicine | Admitting: Emergency Medicine

## 2020-08-03 ENCOUNTER — Telehealth: Payer: Self-pay

## 2020-08-03 DIAGNOSIS — Z5189 Encounter for other specified aftercare: Secondary | ICD-10-CM

## 2020-08-03 DIAGNOSIS — Z48817 Encounter for surgical aftercare following surgery on the skin and subcutaneous tissue: Secondary | ICD-10-CM | POA: Diagnosis not present

## 2020-08-03 LAB — CBC WITH DIFFERENTIAL/PLATELET
Abs Immature Granulocytes: 0 10*3/uL (ref 0.00–0.07)
Basophils Absolute: 0.1 10*3/uL (ref 0.0–0.1)
Basophils Relative: 1 %
Eosinophils Absolute: 0.2 10*3/uL (ref 0.0–0.5)
Eosinophils Relative: 3 %
HCT: 39 % (ref 36.0–46.0)
Hemoglobin: 13 g/dL (ref 12.0–15.0)
Immature Granulocytes: 0 %
Lymphocytes Relative: 47 %
Lymphs Abs: 3.9 10*3/uL (ref 0.7–4.0)
MCH: 32.1 pg (ref 26.0–34.0)
MCHC: 33.3 g/dL (ref 30.0–36.0)
MCV: 96.3 fL (ref 80.0–100.0)
Monocytes Absolute: 0.5 10*3/uL (ref 0.1–1.0)
Monocytes Relative: 7 %
Neutro Abs: 3.4 10*3/uL (ref 1.7–7.7)
Neutrophils Relative %: 42 %
Platelets: 342 10*3/uL (ref 150–400)
RBC: 4.05 MIL/uL (ref 3.87–5.11)
RDW: 13.2 % (ref 11.5–15.5)
WBC: 8 10*3/uL (ref 4.0–10.5)
nRBC: 0 % (ref 0.0–0.2)

## 2020-08-03 MED ORDER — BACITRACIN ZINC 500 UNIT/GM EX OINT
TOPICAL_OINTMENT | Freq: Once | CUTANEOUS | Status: AC
  Filled 2020-08-03: qty 0.9
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
## 2020-08-03 NOTE — Discharge Instructions (Addendum)
Continue antibiotic and ointments as directed by your dermatologist.  Blood and wound cultures are pending.  You will be notified of any positive results.  Return to the ER for worsening symptoms, persistent vomiting, fever or other concerns

## 2020-08-03 NOTE — Telephone Encounter (Signed)
Returned call to [facility] [employer] [facility] concerning patient's labs. What should have been sent in as aerobic/anerobic culture was sent in as a blood culture in error and was discontinued by [facility]. [facility] then added test #[id number]/body fluid culture to specimen, JS

## 2020-08-03 NOTE — Addendum Note (Signed)
Addended by: Alfonso Patten on: 08/03/2020 09:44 AM   Modules accepted: Orders

## 2020-08-05 ENCOUNTER — Ambulatory Visit (INDEPENDENT_AMBULATORY_CARE_PROVIDER_SITE_OTHER): Payer: Medicare Other | Admitting: Dermatology

## 2020-08-05 ENCOUNTER — Other Ambulatory Visit: Payer: Self-pay

## 2020-08-05 DIAGNOSIS — T148XXA Other injury of unspecified body region, initial encounter: Secondary | ICD-10-CM

## 2020-08-05 DIAGNOSIS — L88 Pyoderma gangrenosum: Secondary | ICD-10-CM

## 2020-08-05 MED ORDER — MUPIROCIN 2 % EX OINT: 1.0000 "application " | TOPICAL_OINTMENT | Freq: Two times a day (BID) | CUTANEOUS | 2 refills | Status: DC

## 2020-08-05 MED ORDER — PREDNISONE 20 MG PO TABS: ORAL_TABLET | ORAL | 0 refills | Status: DC

## 2020-08-05 MED ORDER — DOXYCYCLINE MONOHYDRATE 100 MG PO CAPS
100.0000 mg | ORAL_CAPSULE | Freq: Two times a day (BID) | ORAL | 1 refills | Status: AC
Start: 2020-08-05 — End: 2020-08-19

## 2020-08-05 MED ORDER — GENTAMICIN SULFATE 0.1 % EX OINT: 1.0000 "application " | TOPICAL_OINTMENT | Freq: Two times a day (BID) | CUTANEOUS | 2 refills | Status: DC

## 2020-08-05 MED ORDER — TACROLIMUS 0.1 % EX OINT: TOPICAL_OINTMENT | Freq: Two times a day (BID) | CUTANEOUS | 2 refills | Status: DC

## 2020-08-05 NOTE — Patient Instructions (Signed)

## 2020-08-05 NOTE — Progress Notes (Addendum)
Follow-Up Visit   Subjective  [name] is a 40 y.o. female who presents for the following: Follow-up (Patient here today for wound follow up. ).  Ulcer is not getting better, seems worse. Only painful when she is moving and it pulls at the area. No fevers or chills.  She is frustrated this isn't better. She does keep trying to let it air out.   The following portions of the chart were reviewed this encounter and updated as appropriate:   Tobacco  Allergies  Meds  Problems  Med Hx  Surg Hx  Fam Hx      Review of Systems:  No other skin or systemic complaints except as noted in HPI or Assessment and Plan.  Objective  Well appearing patient in no apparent distress; mood and affect are within normal limits.  A focused examination was performed including right thigh. Relevant physical exam findings are noted in the Assessment and Plan.  Objective  Right Medial Thigh: 4.9 x 3.3 cm ulcer right medial thigh with rolled borders, depth 3 mm   Assessment & Plan  Pyoderma gangrenosum Right Medial Thigh  Our culture still pending but ED culture showed many WBCs, rare gram positive cocci and was reincubated  Ulcer has increased in depth since last week, borders are distinctly rolled today, and ulcer has slightly widened since last week  Site has never shown signs of infection other than ulceration. Has not shown pain at rest or drainage.  Exam and history extremely consistent with pyoderma gangrenosum. Discussed biopsy today to rule out infection but pt defers. Given classic appearance and history and desire to avoid additional trauma at the area, will start PO prednisone with close follow-up. Will also repeat culture today and start antibiotic therapy for any worsening or any positive growth on culture.   Pt advised to page me (she has my pager number) with any worsening at area, spreading redness, fever, chills or other concerns.  Will continue doxycycline, mupirocin and  gentamicin for infection prevention especially considering this location.   Advised patient to [place] as this typically causes worsening/spreading. Also counseled patient to avoid elective procedures including tattoos  Will recheck in 6 days. Pt has my pager number if she has any concerns or worsening over the weekend.  Start Prednisone 60 mg daily.   Risks of prednisone taper discussed including mood irritability, insomnia, weight gain, stomach ulcers, increased risk of infection, increased blood sugar (diabetes), hypertension, osteoporosis with long-term or frequent use, and rare risk of avascular necrosis of the hip.   Gently cleanse with hypochlorous acid daily to reduce bacteria at the area (Pt has from [employer]).  Start tacrolimus ointment twice a day to ulcer. After applying tacrolimus ointment, gentamicin ointment and mupirocin ointment, keep covered.  Patient has no known history of inflammatory bowel disease.   Aerobic and anaerobic wound culture repeated today.  Wound bandaged today after mupirocin applied and patient provided with bandages to use until her follow-up in 1 week.   Patient advised keeping it covered and moist will aid in healing over letting it air out. Recommend keeping it covered and with ointment 24/7.    Other Related Procedures Anaerobic and Aerobic Culture  Reordered Medications doxycycline (MONODOX) 100 MG capsule mupirocin ointment (BACTROBAN) 2 % gentamicin ointment (GARAMYCIN) 0.1 %  Return in about 1 week (around 08/12/2020).  Graciella Belton, RMA, am acting as scribe for Forest Gleason, MD .  45 minutes spent in the care of  this patient today, over 50% of which was spent in counseling and coordination of care.   Documentation: I have reviewed the above documentation for accuracy and completeness, and I agree with the above.  Forest Gleason, MD

## 2020-08-06 ENCOUNTER — Encounter: Payer: Self-pay | Admitting: Dermatology

## 2020-08-06 ENCOUNTER — Ambulatory Visit: Payer: Medicaid Other | Admitting: Dermatology

## 2020-08-06 LAB — AEROBIC/ANAEROBIC CULTURE W GRAM STAIN (SURGICAL/DEEP WOUND)

## 2020-08-07 ENCOUNTER — Ambulatory Visit (INDEPENDENT_AMBULATORY_CARE_PROVIDER_SITE_OTHER): Payer: Medicare Other | Admitting: Obstetrics and Gynecology

## 2020-08-07 ENCOUNTER — Encounter: Payer: Self-pay | Admitting: Obstetrics and Gynecology

## 2020-08-07 ENCOUNTER — Other Ambulatory Visit: Payer: Self-pay

## 2020-08-07 VITALS — BP 132/74 | Ht 67.0 in | Wt 213.6 lb

## 2020-08-07 DIAGNOSIS — L88 Pyoderma gangrenosum: Secondary | ICD-10-CM | POA: Diagnosis not present
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
## 2020-08-08 LAB — CULTURE, BLOOD (ROUTINE X 2)
Culture: NO GROWTH
Culture: NO GROWTH

## 2020-08-12 ENCOUNTER — Encounter: Payer: Self-pay | Admitting: Dermatology

## 2020-08-13 ENCOUNTER — Encounter: Payer: Medicare Other | Attending: Internal Medicine | Admitting: Internal Medicine

## 2020-08-13 ENCOUNTER — Other Ambulatory Visit: Payer: Self-pay

## 2020-08-13 DIAGNOSIS — Z87891 Personal history of nicotine dependence: Secondary | ICD-10-CM | POA: Diagnosis not present

## 2020-08-13 DIAGNOSIS — L97119 Non-pressure chronic ulcer of right thigh with unspecified severity: Secondary | ICD-10-CM | POA: Diagnosis not present

## 2020-08-13 NOTE — Progress Notes (Signed)
[name], [name] ([id number]) Visit Report for 08/13/2020 Abuse/Suicide Risk Screen Details Patient Name: [name], [name] Date of Service: 08/13/2020 2:30 PM Medical Record Number: [id number] Patient Account Number: [id number] Date of Birth/Sex: 1980-12-13 (40 y.o. F) Treating RN: Donnamarie Poag Primary Care Coulson Wehner: Threasa Alpha Other Clinician: Referring Hezakiah Champeau: Adrian Prows Treating Aracelia Brinson/Extender: Tito Dine in Treatment: 0 Abuse/Suicide Risk Screen Items Answer ABUSE RISK SCREEN: Has anyone close to you tried to hurt or harm you recentlyo No Do you feel uncomfortable with anyone in your familyo No Has anyone forced you do things that you didnot want to doo No Electronic Signature(s) Signed: 08/13/2020 4:49:04 PM By: Donnamarie Poag Entered By: Donnamarie Poag on 08/13/2020 14:42:21 Zbikowski, Vivianne Spence ([id number]) -------------------------------------------------------------------------------- Activities of Daily Living Details Patient Name: [name] Date of Service: 08/13/2020 2:30 PM Medical Record Number: [id number] Patient Account Number: [id number] Date of Birth/Sex: 1981-02-18 (40 y.o. F) Treating RN: Donnamarie Poag Primary Care Ma Munoz: Threasa Alpha Other Clinician: Referring Aerie Donica: Adrian Prows Treating Coady Train/Extender: Tito Dine in Treatment: 0 Activities of Daily Living Items Answer Activities of Daily Living (Please select one for each item) Drive Automobile Completely Able Take Medications Completely Able Use Telephone Completely Able Care for Appearance Completely Able Use Toilet Completely Able Bath / Shower Completely Able Dress Self Completely Able Feed Self Completely Able Walk Completely Able Get In / Out Bed Completely Able Housework Completely Able Prepare Meals Completely Able Handle Money Completely Able Shop for Self Completely Able Electronic Signature(s) Signed: 08/13/2020 4:49:04 PM By: Donnamarie Poag Entered By: Donnamarie Poag on 08/13/2020 14:42:43 Branagan, Vivianne Spence ([id number]) -------------------------------------------------------------------------------- Education Screening Details Patient Name: [name] Date of Service: 08/13/2020 2:30 PM Medical Record Number: [id number] Patient Account Number: [id number] Date of Birth/Sex: [birth year] (40 y.o. F) Treating RN: Donnamarie Poag Primary Care Mykell Rawl: Threasa Alpha Other Clinician: Referring July Nickson: Adrian Prows Treating Darcelle Herrada/Extender: Tito Dine in Treatment: 0 Learning Preferences/Education Level/Primary Language Learning Preference: Explanation Highest Education Level: College or Above Preferred Language: English Cognitive Barrier Language Barrier: No Translator Needed: No Memory Deficit: No Emotional Barrier: No Cultural/Religious Beliefs Affecting Medical Care: No Physical Barrier Impaired Vision: No Impaired Hearing: No Decreased Hand dexterity: No Knowledge/Comprehension Knowledge Level: High Comprehension Level: High Ability to understand written instructions: High Ability to understand verbal instructions: High Motivation Anxiety Level: Calm Cooperation: Cooperative Education Importance: Acknowledges Need Interest in Health Problems: Asks Questions Perception: Coherent Willingness to Engage in Self-Management High Activities: Readiness to Engage in Self-Management High Activities: Electronic Signature(s) Signed: 08/13/2020 4:49:04 PM By: Donnamarie Poag Entered By: Donnamarie Poag on 08/13/2020 14:43:05 Arshad, Vivianne Spence ([id number]) -------------------------------------------------------------------------------- Fall Risk Assessment Details Patient Name: [name] Date of Service: 08/13/2020 2:30 PM Medical Record Number: [id number] Patient Account Number: [id number] Date of Birth/Sex: 1980-09-04 (40 y.o. F) Treating RN: Donnamarie Poag Primary Care Alfrieda Tarry: Threasa Alpha Other  Clinician: Referring Lupe Bonner: Adrian Prows Treating Braylei Totino/Extender: Tito Dine in Treatment: 0 Fall Risk Assessment Items Have you had 2 or more falls in the last 12 monthso 0 No Have you had any fall that resulted in injury in the last 12 monthso 0 No FALLS RISK SCREEN History of falling - immediate or within 3 months 0 No Secondary diagnosis (Do you have 2 or more medical diagnoseso) 0 No Ambulatory aid None/bed rest/wheelchair/nurse 0 Yes Crutches/cane/walker 0 No Furniture 0 No Intravenous therapy Access/Saline/Heparin Lock 0 No Gait/Transferring Normal/ bed rest/ wheelchair 0 Yes Weak (short steps with or without shuffle,  stooped but able to lift head while walking, may 0 No seek support from furniture) Impaired (short steps with shuffle, may have difficulty arising from chair, head down, impaired 0 No balance) Mental Status Oriented to own ability 0 Yes Electronic Signature(s) Signed: 08/13/2020 4:49:04 PM By: Donnamarie Poag Entered By: Donnamarie Poag on 08/13/2020 14:44:14 Wolbert, Vivianne Spence ([id number]) -------------------------------------------------------------------------------- Foot Assessment Details Patient Name: [name] Date of Service: 08/13/2020 2:30 PM Medical Record Number: [id number] Patient Account Number: [id number] Date of Birth/Sex: 1980-08-24 (40 y.o. F) Treating RN: Donnamarie Poag Primary Care Dyani Babel: Threasa Alpha Other Clinician: Referring Tristy Udovich: Adrian Prows Treating Kalieb Freeland/Extender: Tito Dine in Treatment: 0 Foot Assessment Items Site Locations + = Sensation present, - = Sensation absent, C = Callus, U = Ulcer R = Redness, W = Warmth, M = Maceration, PU = Pre-ulcerative lesion F = Fissure, S = Swelling, D = Dryness Assessment Right: Left: Other Deformity: No No Prior Foot Ulcer: No No Prior Amputation: No No Charcot Joint: No No Ambulatory Status: Ambulatory Without Help Gait:  Steady Electronic Signature(s) Signed: 08/13/2020 4:49:04 PM By: Donnamarie Poag Entered ByDonnamarie Poag on 08/13/2020 14:43:56 Zipper, Vivianne Spence ([id number]) -------------------------------------------------------------------------------- Nutrition Risk Screening Details Patient Name: [name] Date of Service: 08/13/2020 2:30 PM Medical Record Number: [id number] Patient Account Number: [id number] Date of Birth/Sex: 04/13/1981 (40 y.o. F) Treating RN: Donnamarie Poag Primary Care Unnamed Hino: Threasa Alpha Other Clinician: Referring Soul Deveney: Adrian Prows Treating Zamariah Seaborn/Extender: Tito Dine in Treatment: 0 Height (in): 67 Weight (lbs): 210 Body Mass Index (BMI): 32.9 Nutrition Risk Screening Items Score Screening NUTRITION RISK SCREEN: I have an illness or condition that made me change the kind and/or amount of food I eat 0 No I eat fewer than two meals per day 0 No I eat few fruits and vegetables, or milk products 0 No I have three or more drinks of beer, liquor or wine almost every day 0 No I have tooth or mouth problems that make it hard for me to eat 0 No I don't always have enough money to buy the food I need 0 No I eat alone most of the time 0 No I take three or more different prescribed or over-the-counter drugs a day 1 Yes Without wanting to, I have lost or gained 10 pounds in the last six months 0 No I am not always physically able to shop, cook and/or feed myself 0 No Nutrition Protocols Good Risk Protocol 0 No interventions needed Moderate Risk Protocol High Risk Proctocol Risk Level: Good Risk Score: 1 Notes losing weight by choice this year 7 pounds by eliminating sugar and fried food Electronic Signature(s) Signed: 08/13/2020 4:49:04 PM By: Donnamarie Poag Entered ByDonnamarie Poag on 08/13/2020 14:43:49

## 2020-08-13 NOTE — Progress Notes (Signed)
AYRA, HODGDON ([id number]) Visit Report for 08/13/2020 Allergy List Details Patient Name: CHASTY, RANDAL Date of Service: 08/13/2020 2:30 PM Medical Record Number: [id number] Patient Account Number: [id number] Date of Birth/Sex: 07-01-1980 (40 y.o. F) Treating RN: Donnamarie Poag Primary Care Oaklie Durrett: Threasa Alpha Other Clinician: Referring Raydin Bielinski: Adrian Prows Treating Gladyce Mcray/Extender: Ricard Dillon Weeks in Treatment: 0 Allergies Active Allergies No Known Allergies Allergy Notes Electronic Signature(s) Signed: 08/13/2020 4:49:04 PM By: Donnamarie Poag Entered By: Donnamarie Poag on 08/13/2020 14:38:35 Klepacki, Vivianne Spence ([id number]) -------------------------------------------------------------------------------- Arrival Information Details Patient Name: [name] Date of Service: 08/13/2020 2:30 PM Medical Record Number: [id number] Patient Account Number: [id number] Date of Birth/Sex: [birth year] (40 y.o. F) Treating RN: Donnamarie Poag Primary Care Mariell Nester: Threasa Alpha Other Clinician: Referring Shalamar Crays: Adrian Prows Treating Willy Vorce/Extender: Tito Dine in Treatment: 0 Visit Information Patient Arrived: Ambulatory Arrival Time: 14:34 Accompanied By: mom Transfer Assistance: None Patient Identification Verified: Yes Secondary Verification Process Completed: Yes Patient Has Alerts: Yes Patient Alerts: NOT diabetic Electronic Signature(s) Signed: 08/13/2020 4:49:04 PM By: Donnamarie Poag Entered By: Donnamarie Poag on 08/13/2020 14:34:50 Tulloch, Vivianne Spence ([id number]) -------------------------------------------------------------------------------- Clinic Level of Care Assessment Details Patient Name: [name] Date of Service: 08/13/2020 2:30 PM Medical Record Number: [id number] Patient Account Number: [id number] Date of Birth/Sex: Sep 17, 1980 (40 y.o. F) Treating RN: Dolan Amen Primary Care Drinda Belgard: Threasa Alpha Other Clinician: Referring  Tarron Krolak: Adrian Prows Treating Shery Wauneka/Extender: Tito Dine in Treatment: 0 Clinic Level of Care Assessment Items TOOL 1 Quantity Score X - Use when EandM and Procedure is performed on INITIAL visit 1 0 ASSESSMENTS - Nursing Assessment / Reassessment X - General Physical Exam (combine w/ comprehensive assessment (listed just below) when performed on new 1 20 pt. evals) X- 1 25 Comprehensive Assessment (HX, ROS, Risk Assessments, Wounds Hx, etc.) ASSESSMENTS - Wound and Skin Assessment / Reassessment []  - Dermatologic / Skin Assessment (not related to wound area) 0 ASSESSMENTS - Ostomy and/or Continence Assessment and Care []  - Incontinence Assessment and Management 0 []  - 0 Ostomy Care Assessment and Management (repouching, etc.) PROCESS - Coordination of Care X - Simple Patient / Family Education for ongoing care 1 15 []  - 0 Complex (extensive) Patient / Family Education for ongoing care X- 1 10 Staff obtains Consents, Records, Test Results / Process Orders []  - 0 Staff telephones HHA, Nursing Homes / Clarify orders / etc []  - 0 Routine Transfer to another Facility (non-emergent condition) []  - 0 Routine Hospital Admission (non-emergent condition) []  - 0 New Admissions / Biomedical engineer / Ordering NPWT, Apligraf, etc. []  - 0 Emergency Hospital Admission (emergent condition) PROCESS - Special Needs []  - Pediatric / Minor Patient Management 0 []  - 0 Isolation Patient Management []  - 0 Hearing / Language / Visual special needs []  - 0 Assessment of Community assistance (transportation, D/C planning, etc.) []  - 0 Additional assistance / Altered mentation []  - 0 Support Surface(s) Assessment (bed, cushion, seat, etc.) INTERVENTIONS - Miscellaneous []  - External ear exam 0 []  - 0 Patient Transfer (multiple staff / Civil Service fast streamer / Similar devices) []  - 0 Simple Staple / Suture removal (25 or less) []  - 0 Complex Staple / Suture removal (26 or  more) []  - 0 Hypo/Hyperglycemic Management (do not check if billed separately) []  - 0 Ankle / Brachial Index (ABI) - do not check if billed separately Has the patient been seen at the hospital within the last three years: Yes Total Score: 70 Level Of Care:  New/Established - Level 2 ARIYANNAH, PAULING ([id number]) Electronic Signature(s) Signed: 08/13/2020 5:05:31 PM By: Phillis Haggis, Dondra Prader RN Entered By: Phillis Haggis, Dondra Prader on 08/13/2020 15:36:05 Conkey, [name] ([id number]) -------------------------------------------------------------------------------- Encounter Discharge Information Details Patient Name: [name] Date of Service: 08/13/2020 2:30 PM Medical Record Number: [id number] Patient Account Number: [id number] Date of Birth/Sex: [birth year] (40 y.o. F) Treating RN: Rogers Blocker Primary Care Anastassia Noack: Franco Nones Other Clinician: Referring Yoshika Vensel: Adelene Idler Treating Tarry Fountain/Extender: Altamese [place] in Treatment: 0 Encounter Discharge Information Items Post Procedure Vitals Discharge Condition: Stable Temperature (F): 98.2 Ambulatory Status: Ambulatory Pulse (bpm): 65 Discharge Destination: Home Respiratory Rate (breaths/min): 18 Transportation: Private Auto Blood Pressure (mmHg): 152/82 Accompanied By: mom Schedule Follow-up Appointment: Yes Clinical Summary of Care: Electronic Signature(s) Signed: 08/13/2020 3:56:21 PM By: Lolita Cram Entered By: Lolita Cram on 08/13/2020 15:56:21 Quesnel, [name] ([id number]) -------------------------------------------------------------------------------- Lower Extremity Assessment Details Patient Name: [name] Date of Service: 08/13/2020 2:30 PM Medical Record Number: [id number] Patient Account Number: [id number] Date of Birth/Sex: [birth year] (40 y.o. F) Treating RN: Hansel Feinstein Primary Care Adrienne Delay: Franco Nones Other Clinician: Referring Agness Sibrian: Adelene Idler Treating  Ellon Marasco/Extender: Maxwell Caul Weeks in Treatment: 0 Electronic Signature(s) Signed: 08/13/2020 4:49:04 PM By: Hansel Feinstein Entered By: Hansel Feinstein on 08/13/2020 14:44:42 Delis, [name] ([id number]) -------------------------------------------------------------------------------- Multi Wound Chart Details Patient Name: [name] Date of Service: 08/13/2020 2:30 PM Medical Record Number: [id number] Patient Account Number: [id number] Date of Birth/Sex: [birth year] (40 y.o. F) Treating RN: Rogers Blocker Primary Care Eloy Fehl: Franco Nones Other Clinician: Referring Mihira Tozzi: Adelene Idler Treating Laqueisha Catalina/Extender: Altamese Otis in Treatment: 0 Vital Signs Height(in): 67 Pulse(bpm): 65 Weight(lbs): 210 Blood Pressure(mmHg): 125/82 Body Mass Index(BMI): 33 Temperature(F): 98.2 Respiratory Rate(breaths/min): 18 Photos: [N/A:N/A] Wound Location: Right, Proximal Upper Leg N/A N/A Wounding Event: Bump N/A N/A Primary Etiology: To be determined N/A N/A Comorbid History: History of Burn, Osteoarthritis N/A N/A Date Acquired: 08/07/2020 N/A N/A Weeks of Treatment: 0 N/A N/A Wound Status: Open N/A N/A Measurements L x W x D (cm) 2.2x5.3x0.2 N/A N/A Area (cm) : 9.158 N/A N/A Volume (cm) : 1.832 N/A N/A Classification: Full Thickness Without Exposed N/A N/A Support Structures Exudate Amount: Medium N/A N/A Exudate Type: Serosanguineous N/A N/A Exudate Color: red, brown N/A N/A Wound Margin: Flat and Intact N/A N/A Granulation Amount: Large (67-100%) N/A N/A Granulation Quality: Red N/A N/A Necrotic Amount: Small (1-33%) N/A N/A Exposed Structures: Fat Layer (Subcutaneous Tissue): N/A N/A Yes Fascia: No Tendon: No Muscle: No Joint: No Bone: No Treatment Notes Electronic Signature(s) Signed: 08/13/2020 5:05:31 PM By: Phillis Haggis, Dondra Prader RN Entered By: Phillis Haggis, Dondra Prader on 08/13/2020 15:27:30 Hirschfeld, [name]  ([id number]) -------------------------------------------------------------------------------- Multi-Disciplinary Care Plan Details Patient Name: [name] Date of Service: 08/13/2020 2:30 PM Medical Record Number: [id number] Patient Account Number: [id number] Date of Birth/Sex: 12-06-1980 (40 y.o. F) Treating RN: Rogers Blocker Primary Care Greidys Deland: Franco Nones Other Clinician: Referring Dorothymae Maciver: Adelene Idler Treating Braidyn Scorsone/Extender: Altamese [place] in Treatment: 0 Active Inactive Orientation to the Wound Care Program Nursing Diagnoses: Knowledge deficit related to the wound healing center program Goals: Patient/caregiver will verbalize understanding of the Wound Healing Center Program Date Initiated: 08/13/2020 Target Resolution Date: 08/13/2020 Goal Status: Active Interventions: Provide education on orientation to the wound center Notes: Wound/Skin Impairment Nursing Diagnoses: Impaired tissue integrity Goals: Patient/caregiver will verbalize understanding of skin care regimen Date Initiated: 08/13/2020 Target Resolution Date: 08/13/2020 Goal Status: Active Ulcer/skin breakdown will have a volume  reduction of 30% by week 4 Date Initiated: 08/13/2020 Target Resolution Date: 09/12/2020 Goal Status: Active Ulcer/skin breakdown will have a volume reduction of 50% by week 8 Date Initiated: 08/13/2020 Target Resolution Date: 10/13/2020 Goal Status: Active Ulcer/skin breakdown will have a volume reduction of 80% by week 12 Date Initiated: 08/13/2020 Target Resolution Date: 11/12/2020 Goal Status: Active Ulcer/skin breakdown will heal within 14 weeks Date Initiated: 08/13/2020 Target Resolution Date: 12/13/2020 Goal Status: Active Interventions: Assess patient/caregiver ability to obtain necessary supplies Assess patient/caregiver ability to perform ulcer/skin care regimen upon admission and as needed Assess ulceration(s) every visit Provide education on  ulcer and skin care Treatment Activities: Referred to DME Jessyca Sloan for dressing supplies : 08/13/2020 Skin care regimen initiated : 08/13/2020 Notes: Electronic Signature(s) Signed: 08/13/2020 5:05:31 PM By: Georges Mouse, Minus Breeding RN Entered By: Georges Mouse, Kenia on 08/13/2020 15:27:17 Slomski, Vivianne Spence ([id number]) Boykin Reaper, Vivianne Spence ([id number]) -------------------------------------------------------------------------------- Pain Assessment Details Patient Name: [name] Date of Service: 08/13/2020 2:30 PM Medical Record Number: [id number] Patient Account Number: [id number] Date of Birth/Sex: 03-19-1981 (40 y.o. F) Treating RN: Donnamarie Poag Primary Care Tabita Corbo: Threasa Alpha Other Clinician: Referring Cresencia Asmus: Adrian Prows Treating Phila Shoaf/Extender: Tito Dine in Treatment: 0 Active Problems Location of Pain Severity and Description of Pain Patient Has Paino Yes Site Locations Pain Location: Pain in Ulcers Rate the pain. Current Pain Level: 8 Pain Management and Medication Current Pain Management: Electronic Signature(s) Signed: 08/13/2020 4:49:04 PM By: Donnamarie Poag Entered By: Donnamarie Poag on 08/13/2020 14:35:03 Wren, Vivianne Spence ([id number]) -------------------------------------------------------------------------------- Patient/Caregiver Education Details Patient Name: [name] Date of Service: 08/13/2020 2:30 PM Medical Record Number: [id number] Patient Account Number: [id number] Date of Birth/Gender: Dec 05, 1980 (40 y.o. F) Treating RN: Dolan Amen Primary Care Physician: Threasa Alpha Other Clinician: Referring Physician: Adrian Prows Treating Physician/Extender: Tito Dine in Treatment: 0 Education Assessment Education Provided To: Patient Education Topics Provided Welcome To [facility]: Methods: Explain/Verbal Responses: State content correctly Wound/Skin Impairment: Methods:  Explain/Verbal Responses: State content correctly Electronic Signature(s) Signed: 08/13/2020 5:05:31 PM By: Georges Mouse, Minus Breeding RN Entered By: Georges Mouse, Minus Breeding on 08/13/2020 15:36:24 Jacob, Vivianne Spence ([id number]) -------------------------------------------------------------------------------- Wound Assessment Details Patient Name: [name] Date of Service: 08/13/2020 2:30 PM Medical Record Number: [id number] Patient Account Number: [id number] Date of Birth/Sex: March 15, 1981 (40 y.o. F) Treating RN: Donnamarie Poag Primary Care Loana Salvaggio: Threasa Alpha Other Clinician: Referring Ardra Kuznicki: Adrian Prows Treating Shakala Marlatt/Extender: Ricard Dillon Weeks in Treatment: 0 Wound Status Wound Number: 1 Primary Etiology: To be determined Wound Location: Right, Proximal Upper Leg Wound Status: Open Wounding Event: Bump Comorbid History: History of Burn, Osteoarthritis Date Acquired: 08/07/2020 Weeks Of Treatment: 0 Clustered Wound: No Photos Wound Measurements Length: (cm) 2.2 Width: (cm) 5.3 Depth: (cm) 0.2 Area: (cm) 9.158 Volume: (cm) 1.832 % Reduction in Area: % Reduction in Volume: Tunneling: No Undermining: No Wound Description Classification: Full Thickness Without Exposed Support Structu Wound Margin: Flat and Intact Exudate Amount: Medium Exudate Type: Serosanguineous Exudate Color: red, brown res Foul Odor After Cleansing: No Slough/Fibrino Yes Wound Bed Granulation Amount: Large (67-100%) Exposed Structure Granulation Quality: Red Fascia Exposed: No Necrotic Amount: Small (1-33%) Fat Layer (Subcutaneous Tissue) Exposed: Yes Necrotic Quality: Adherent Slough Tendon Exposed: No Muscle Exposed: No Joint Exposed: No Bone Exposed: No Electronic Signature(s) Signed: 08/13/2020 4:49:04 PM By: Donnamarie Poag Entered By: Donnamarie Poag on 08/13/2020 14:59:56 Taves, Vivianne Spence  ([id number]) -------------------------------------------------------------------------------- [place] Details Patient Name: [name] Date of Service: 08/13/2020 2:30 PM  Medical Record Number: [id number] Patient Account Number: [id number] Date of Birth/Sex: Sep 04, 1980 (40 y.o. F) Treating RN: Donnamarie Poag Primary Care Jamair Cato: Threasa Alpha Other Clinician: Referring Dessire Grimes: Adrian Prows Treating Dominika Losey/Extender: Tito Dine in Treatment: 0 Vital Signs Time Taken: 14:34 Temperature (F): 98.2 Height (in): 67 Pulse (bpm): 65 Source: Stated Respiratory Rate (breaths/min): 18 Weight (lbs): 210 Blood Pressure (mmHg): 125/82 Source: Stated Reference Range: 80 - 120 mg / dl Body Mass Index (BMI): 32.9 Electronic Signature(s) Signed: 08/13/2020 4:49:04 PM By: Donnamarie Poag Entered ByDonnamarie Poag on 08/13/2020 14:38:10
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
## 2020-08-14 LAB — ANAEROBIC AND AEROBIC CULTURE

## 2020-08-15 LAB — SPECIMEN STATUS REPORT

## 2020-08-15 LAB — BODY FLUID CULTURE

## 2020-08-21 ENCOUNTER — Other Ambulatory Visit: Payer: Self-pay

## 2020-08-21 ENCOUNTER — Encounter: Payer: Medicare Other | Attending: Physician Assistant | Admitting: Physician Assistant

## 2020-08-21 DIAGNOSIS — T8131XA Disruption of external operation (surgical) wound, not elsewhere classified, initial encounter: Secondary | ICD-10-CM | POA: Diagnosis not present

## 2020-08-21 DIAGNOSIS — L97118 Non-pressure chronic ulcer of right thigh with other specified severity: Secondary | ICD-10-CM | POA: Diagnosis not present

## 2020-08-21 DIAGNOSIS — Y838 Other surgical procedures as the cause of abnormal reaction of the patient, or of later complication, without mention of misadventure at the time of the procedure: Secondary | ICD-10-CM | POA: Insufficient documentation
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
## 2020-08-28 ENCOUNTER — Other Ambulatory Visit: Payer: Self-pay

## 2020-08-28 ENCOUNTER — Encounter: Payer: Medicare Other | Admitting: Physician Assistant

## 2020-08-28 DIAGNOSIS — T8131XA Disruption of external operation (surgical) wound, not elsewhere classified, initial encounter: Secondary | ICD-10-CM | POA: Diagnosis not present
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
## 2020-08-28 NOTE — Progress Notes (Signed)
[name], [name] ([id number]) Visit Report for 08/28/2020 Arrival Information Details Patient Name: Angela, Stone Date of Service: 08/28/2020 11:15 AM Medical Record Number: [id number] Patient Account Number: [id number] Date of Birth/Sex: Jul 04, 1980 (40 y.o. F) Treating RN: Donnamarie Poag Primary Care Tailey Top: Threasa Alpha Other Clinician: Referring Delfina Schreurs: Threasa Alpha Treating Loras Grieshop/Extender: Skipper Cliche in Treatment: 2 Visit Information History Since Last Visit Added or deleted any medications: No Patient Arrived: Ambulatory Had a fall or experienced change in No Arrival Time: 11:04 activities of daily living that may affect Accompanied By: self risk of falls: Transfer Assistance: None Hospitalized since last visit: No Patient Identification Verified: Yes Has Dressing in Place as Prescribed: Yes Secondary Verification Process Completed: Yes Pain Present Now: Yes Patient Has Alerts: Yes Patient Alerts: NOT diabetic Electronic Signature(s) Signed: 08/28/2020 4:21:58 PM By: Donnamarie Poag Entered By: Donnamarie Poag on 08/28/2020 11:05:17 Devinney, Vivianne Spence ([id number]) -------------------------------------------------------------------------------- Clinic Level of Care Assessment Details Patient Name: [name] Date of Service: 08/28/2020 11:15 AM Medical Record Number: [id number] Patient Account Number: [id number] Date of Birth/Sex: 10/25/1980 (40 y.o. F) Treating RN: Dolan Amen Primary Care Derrico Zhong: Threasa Alpha Other Clinician: Referring Angelle Isais: Threasa Alpha Treating Philomina Leon/Extender: Skipper Cliche in Treatment: 2 Clinic Level of Care Assessment Items TOOL 4 Quantity Score X - Use when only an EandM is performed on FOLLOW-UP visit 1 0 ASSESSMENTS - Nursing Assessment / Reassessment X - Reassessment of Co-morbidities (includes updates in patient status) 1 10 X- 1 5 Reassessment of Adherence to Treatment Plan ASSESSMENTS - Wound and Skin  Assessment / Reassessment X - Simple Wound Assessment / Reassessment - one wound 1 5 []  - 0 Complex Wound Assessment / Reassessment - multiple wounds []  - 0 Dermatologic / Skin Assessment (not related to wound area) ASSESSMENTS - Focused Assessment []  - Circumferential Edema Measurements - multi extremities 0 []  - 0 Nutritional Assessment / Counseling / Intervention []  - 0 Lower Extremity Assessment (monofilament, tuning fork, pulses) []  - 0 Peripheral Arterial Disease Assessment (using hand held doppler) ASSESSMENTS - Ostomy and/or Continence Assessment and Care []  - Incontinence Assessment and Management 0 []  - 0 Ostomy Care Assessment and Management (repouching, etc.) PROCESS - Coordination of Care X - Simple Patient / Family Education for ongoing care 1 15 []  - 0 Complex (extensive) Patient / Family Education for ongoing care []  - 0 Staff obtains Programmer, systems, Records, Test Results / Process Orders []  - 0 Staff telephones HHA, Nursing Homes / Clarify orders / etc []  - 0 Routine Transfer to another Facility (non-emergent condition) []  - 0 Routine Hospital Admission (non-emergent condition) []  - 0 New Admissions / Biomedical engineer / Ordering NPWT, Apligraf, etc. []  - 0 Emergency Hospital Admission (emergent condition) X- 1 10 Simple Discharge Coordination []  - 0 Complex (extensive) Discharge Coordination PROCESS - Special Needs []  - Pediatric / Minor Patient Management 0 []  - 0 Isolation Patient Management []  - 0 Hearing / Language / Visual special needs []  - 0 Assessment of Community assistance (transportation, D/C planning, etc.) []  - 0 Additional assistance / Altered mentation []  - 0 Support Surface(s) Assessment (bed, cushion, seat, etc.) INTERVENTIONS - Wound Cleansing / Measurement Katen, Li T. ([id number]) X- 1 5 Simple Wound Cleansing - one wound []  - 0 Complex Wound Cleansing - multiple wounds X- 1 5 Wound Imaging (photographs - any number of  wounds) []  - 0 Wound Tracing (instead of photographs) X- 1 5 Simple Wound Measurement - one wound []  - 0 Complex Wound Measurement -  multiple wounds INTERVENTIONS - Wound Dressings []  - Small Wound Dressing one or multiple wounds 0 X- 1 15 Medium Wound Dressing one or multiple wounds []  - 0 Large Wound Dressing one or multiple wounds []  - 0 Application of Medications - topical []  - 0 Application of Medications - injection INTERVENTIONS - Miscellaneous []  - External ear exam 0 []  - 0 Specimen Collection (cultures, biopsies, blood, body fluids, etc.) []  - 0 Specimen(s) / Culture(s) sent or taken to Lab for analysis []  - 0 Patient Transfer (multiple staff / Civil Service fast streamer / Similar devices) []  - 0 Simple Staple / Suture removal (25 or less) []  - 0 Complex Staple / Suture removal (26 or more) []  - 0 Hypo / Hyperglycemic Management (close monitor of Blood Glucose) []  - 0 Ankle / Brachial Index (ABI) - do not check if billed separately X- 1 5 Vital Signs Has the patient been seen at the hospital within the last three years: Yes Total Score: 80 Level Of Care: New/Established - Level 3 Electronic Signature(s) Signed: 08/28/2020 11:53:36 AM By: Georges Mouse, Minus Breeding RN Entered By: Georges Mouse, Kenia on 08/28/2020 11:36:36 Pendry, Vivianne Spence ([id number]) -------------------------------------------------------------------------------- Lower Extremity Assessment Details Patient Name: [name] Date of Service: 08/28/2020 11:15 AM Medical Record Number: [id number] Patient Account Number: [id number] Date of Birth/Sex: 07/01/1980 (40 y.o. F) Treating RN: Donnamarie Poag Primary Care Filiberto Wamble: Threasa Alpha Other Clinician: Referring Lorene Klimas: Threasa Alpha Treating Cathryn Gallery/Extender: Jeri Cos Weeks in Treatment: 2 Vascular Assessment Pulses: Dorsalis Pedis Palpable: [Right:Yes] Electronic Signature(s) Signed: 08/28/2020 4:21:58 PM By: Donnamarie Poag Entered By: Donnamarie Poag  on 08/28/2020 11:10:06 Dysart, Vivianne Spence ([id number]) -------------------------------------------------------------------------------- Multi Wound Chart Details Patient Name: [name] Date of Service: 08/28/2020 11:15 AM Medical Record Number: [id number] Patient Account Number: [id number] Date of Birth/Sex: [birth year] (40 y.o. F) Treating RN: Dolan Amen Primary Care Katy Brickell: Threasa Alpha Other Clinician: Referring Jaye Saal: Threasa Alpha Treating Asahd Can/Extender: Skipper Cliche in Treatment: 2 Vital Signs Height(in): 67 Pulse(bpm): 98 Weight(lbs): 210 Blood Pressure(mmHg): 119/76 Body Mass Index(BMI): 33 Temperature(F): 98.5 Respiratory Rate(breaths/min): 16 Photos: [N/A:N/A] Wound Location: Right, Proximal Upper Leg N/A N/A Wounding Event: Surgical Injury N/A N/A Primary Etiology: Dehisced Wound N/A N/A Comorbid History: History of Burn, Osteoarthritis N/A N/A Date Acquired: 08/07/2020 N/A N/A Weeks of Treatment: 2 N/A N/A Wound Status: Open N/A N/A Measurements L x W x D (cm) 2x2x0.1 N/A N/A Area (cm) : 3.142 N/A N/A Volume (cm) : 0.314 N/A N/A % Reduction in Area: 65.70% N/A N/A % Reduction in Volume: 82.90% N/A N/A Classification: Full Thickness Without Exposed N/A N/A Support Structures Exudate Amount: Medium N/A N/A Exudate Type: Serosanguineous N/A N/A Exudate Color: red, brown N/A N/A Wound Margin: Flat and Intact N/A N/A Granulation Amount: Large (67-100%) N/A N/A Granulation Quality: Red N/A N/A Necrotic Amount: Small (1-33%) N/A N/A Exposed Structures: Fat Layer (Subcutaneous Tissue): N/A N/A Yes Fascia: No Tendon: No Muscle: No Joint: No Bone: No Epithelialization: Small (1-33%) N/A N/A Treatment Notes Electronic Signature(s) Signed: 08/28/2020 11:53:36 AM By: Georges Mouse, Minus Breeding RN Entered By: Georges Mouse, Minus Breeding on 08/28/2020 11:35:47 Kustra, Vivianne Spence  ([id number]) -------------------------------------------------------------------------------- [place] Details Patient Name: [name] Date of Service: 08/28/2020 11:15 AM Medical Record Number: [id number] Patient Account Number: [id number] Date of Birth/Sex: 05-15-1980 (40 y.o. F) Treating RN: Dolan Amen Primary Care Quisha Mabie: Threasa Alpha Other Clinician: Referring Aarini Slee: Threasa Alpha Treating Kason Benak/Extender: Skipper Cliche in Treatment: 2 Active Inactive Wound/Skin Impairment Nursing Diagnoses: Impaired tissue integrity Goals:  Patient/caregiver will verbalize understanding of skin care regimen Date Initiated: 08/13/2020 Target Resolution Date: 09/12/2020 Goal Status: Active Ulcer/skin breakdown will have a volume reduction of 30% by week 4 Date Initiated: 08/13/2020 Target Resolution Date: 09/12/2020 Goal Status: Active Ulcer/skin breakdown will have a volume reduction of 50% by week 8 Date Initiated: 08/13/2020 Target Resolution Date: 10/13/2020 Goal Status: Active Ulcer/skin breakdown will have a volume reduction of 80% by week 12 Date Initiated: 08/13/2020 Target Resolution Date: 11/12/2020 Goal Status: Active Ulcer/skin breakdown will heal within 14 weeks Date Initiated: 08/13/2020 Target Resolution Date: 12/13/2020 Goal Status: Active Interventions: Assess patient/caregiver ability to obtain necessary supplies Assess patient/caregiver ability to perform ulcer/skin care regimen upon admission and as needed Assess ulceration(s) every visit Provide education on ulcer and skin care Treatment Activities: Referred to DME Larita Deremer for dressing supplies : 08/13/2020 Skin care regimen initiated : 08/13/2020 Notes: Electronic Signature(s) Signed: 08/28/2020 11:53:36 AM By: Georges Mouse, Minus Breeding RN Entered By: Georges Mouse, Minus Breeding on 08/28/2020 11:35:40 Orlov, Vivianne Spence  ([id number]) -------------------------------------------------------------------------------- Pain Assessment Details Patient Name: [name] Date of Service: 08/28/2020 11:15 AM Medical Record Number: [id number] Patient Account Number: [id number] Date of Birth/Sex: [birth year] (40 y.o. F) Treating RN: Donnamarie Poag Primary Care Danija Gosa: Threasa Alpha Other Clinician: Referring Rashad Obeid: Threasa Alpha Treating Cedar Roseman/Extender: Skipper Cliche in Treatment: 2 Active Problems Location of Pain Severity and Description of Pain Patient Has Paino Yes Site Locations Pain Location: Pain in Ulcers Rate the pain. Current Pain Level: 6 Pain Management and Medication Current Pain Management: Electronic Signature(s) Signed: 08/28/2020 4:21:58 PM By: Donnamarie Poag Entered By: Donnamarie Poag on 08/28/2020 11:07:01 Venn, Vivianne Spence ([id number]) -------------------------------------------------------------------------------- Patient/Caregiver Education Details Patient Name: [name] Date of Service: 08/28/2020 11:15 AM Medical Record Number: [id number] Patient Account Number: [id number] Date of Birth/Gender: 07-Dec-1980 (40 y.o. F) Treating RN: Dolan Amen Primary Care Physician: Threasa Alpha Other Clinician: Referring Physician: Threasa Alpha Treating Physician/Extender: Skipper Cliche in Treatment: 2 Education Assessment Education Provided To: Patient Education Topics Provided Wound/Skin Impairment: Methods: Explain/Verbal Responses: State content correctly Electronic Signature(s) Signed: 08/28/2020 11:53:36 AM By: Georges Mouse, Minus Breeding RN Entered By: Georges Mouse, Minus Breeding on 08/28/2020 11:36:46 Bodley, Vivianne Spence ([id number]) -------------------------------------------------------------------------------- Wound Assessment Details Patient Name: [name] Date of Service: 08/28/2020 11:15 AM Medical Record Number: [id number] Patient Account Number: [id number] Date  of Birth/Sex: May 26, 1980 (40 y.o. F) Treating RN: Donnamarie Poag Primary Care Giordana Weinheimer: Threasa Alpha Other Clinician: Referring Calub Tarnow: Threasa Alpha Treating Kahley Leib/Extender: Jeri Cos Weeks in Treatment: 2 Wound Status Wound Number: 1 Primary Etiology: Dehisced Wound Wound Location: Right, Proximal Upper Leg Wound Status: Open Wounding Event: Surgical Injury Comorbid History: History of Burn, Osteoarthritis Date Acquired: 08/07/2020 Weeks Of Treatment: 2 Clustered Wound: No Photos Wound Measurements Length: (cm) 2 Width: (cm) 2 Depth: (cm) 0.1 Area: (cm) 3.142 Volume: (cm) 0.314 % Reduction in Area: 65.7% % Reduction in Volume: 82.9% Epithelialization: Small (1-33%) Tunneling: No Undermining: No Wound Description Classification: Full Thickness Without Exposed Support Structu Wound Margin: Flat and Intact Exudate Amount: Medium Exudate Type: Serosanguineous Exudate Color: red, brown res Foul Odor After Cleansing: No Slough/Fibrino Yes Wound Bed Granulation Amount: Large (67-100%) Exposed Structure Granulation Quality: Red Fascia Exposed: No Necrotic Amount: Small (1-33%) Fat Layer (Subcutaneous Tissue) Exposed: Yes Necrotic Quality: Adherent Slough Tendon Exposed: No Muscle Exposed: No Joint Exposed: No Bone Exposed: No Electronic Signature(s) Signed: 08/28/2020 4:21:58 PM By: Donnamarie Poag Entered By: Donnamarie Poag on 08/28/2020 11:09:49 Simenson, Vivianne Spence ([id number]) -------------------------------------------------------------------------------- [place] Details Patient Name:  [name] Date of Service: 08/28/2020 11:15 AM Medical Record Number: [id number] Patient Account Number: [id number] Date of Birth/Sex: Jul 04, 1980 (40 y.o. F) Treating RN: Donnamarie Poag Primary Care Brigida Scotti: Threasa Alpha Other Clinician: Referring Tyrone Pautsch: Threasa Alpha Treating Hermena Swint/Extender: Skipper Cliche in Treatment: 2 Vital Signs Time Taken: 11:04 Temperature  (F): 98.5 Height (in): 67 Pulse (bpm): 71 Weight (lbs): 210 Respiratory Rate (breaths/min): 16 Body Mass Index (BMI): 32.9 Blood Pressure (mmHg): 119/76 Reference Range: 80 - 120 mg / dl Electronic Signature(s) Signed: 08/28/2020 4:21:58 PM By: Donnamarie Poag Entered ByDonnamarie Poag on 08/28/2020 11:06:52

## 2020-08-31 NOTE — Progress Notes (Signed)
TANGANYIKA, BOWLDS ([id number]) Visit Report for 08/21/2020 Arrival Information Details Patient Name: [name], [name] Date of Service: 08/21/2020 10:30 AM Medical Record Number: [id number] Patient Account Number: [id number] Date of Birth/Sex: 1980-07-15 (40 y.o. F) Treating RN: [name] Primary Care [name]: [name] Other Clinician: Referring [name]: [name] Treating [name]/Extender: [name] in Treatment: 1 Visit Information History Since Last Visit Added or deleted any medications: No Patient Arrived: Ambulatory Had a fall or experienced change in No Arrival Time: 10:37 activities of daily living that may affect Accompanied By: mother risk of falls: Transfer Assistance: None Hospitalized since last visit: No Patient Identification Verified: Yes Pain Present Now: Yes Secondary Verification Process Completed: Yes Patient Has Alerts: Yes Patient Alerts: NOT diabetic Electronic Signature(s) Signed: 08/21/2020 3:59:56 PM By: [name] Entered By: [name] on 08/21/2020 10:37:44 [name] ([id number]) -------------------------------------------------------------------------------- Clinic Level of Care Assessment Details Patient Name: [name] Date of Service: 08/21/2020 10:30 AM Medical Record Number: [id number] Patient Account Number: [id number] Date of Birth/Sex: 12/27/1980 (40 y.o. F) Treating RN: [name] Primary Care Jakhi Dishman: [name] Other Clinician: Referring [name]: [name] Treating [name]/Extender: [name] in Treatment: 1 Clinic Level of Care Assessment Items TOOL 4 Quantity Score X - Use when only an EandM is performed on FOLLOW-UP visit 1 0 ASSESSMENTS - Nursing Assessment / Reassessment X - Reassessment of Co-morbidities (includes updates in patient status) 1 10 X- 1 5 Reassessment of Adherence to Treatment Plan ASSESSMENTS - Wound and Skin Assessment / Reassessment X - Simple Wound  Assessment / Reassessment - one wound 1 5 []  - 0 Complex Wound Assessment / Reassessment - multiple wounds []  - 0 Dermatologic / Skin Assessment (not related to wound area) ASSESSMENTS - Focused Assessment []  - Circumferential Edema Measurements - multi extremities 0 []  - 0 Nutritional Assessment / Counseling / Intervention []  - 0 Lower Extremity Assessment (monofilament, tuning fork, pulses) []  - 0 Peripheral Arterial Disease Assessment (using hand held doppler) ASSESSMENTS - Ostomy and/or Continence Assessment and Care []  - Incontinence Assessment and Management 0 []  - 0 Ostomy Care Assessment and Management (repouching, etc.) PROCESS - Coordination of Care X - Simple Patient / Family Education for ongoing care 1 15 []  - 0 Complex (extensive) Patient / Family Education for ongoing care []  - 0 Staff obtains , Records, Test Results / Process Orders []  - 0 Staff telephones HHA, Nursing Homes / Clarify orders / etc []  - 0 Routine Transfer to another Facility (non-emergent condition) []  - 0 Routine Hospital Admission (non-emergent condition) []  - 0 New Admissions / / Ordering NPWT, Apligraf, etc. []  - 0 Emergency Hospital Admission (emergent condition) X- 1 10 Simple Discharge Coordination []  - 0 Complex (extensive) Discharge Coordination PROCESS - Special Needs []  - Pediatric / Minor Patient Management 0 []  - 0 Isolation Patient Management []  - 0 Hearing / Language / Visual special needs []  - 0 Assessment of Community assistance (transportation, D/C planning, etc.) []  - 0 Additional assistance / Altered mentation []  - 0 Support Surface(s) Assessment (bed, cushion, seat, etc.) INTERVENTIONS - Wound Cleansing / Measurement Meloche, Donielle T. ( ) X- 1 5 Simple Wound Cleansing - one wound []  - 0 Complex Wound Cleansing - multiple wounds X- 1 5 Wound Imaging (photographs - any number of wounds) []  - 0 Wound Tracing (instead of  photographs) X- 1 5 Simple Wound Measurement - one wound []  - 0 Complex Wound Measurement - multiple wounds INTERVENTIONS - Wound Dressings  X - Small Wound Dressing one or multiple wounds 1 10 []  - 0 Medium Wound Dressing one or multiple wounds []  - 0 Large Wound Dressing one or multiple wounds X- 1 5 Application of Medications - topical []  - 0 Application of Medications - injection INTERVENTIONS - Miscellaneous []  - External ear exam 0 []  - 0 Specimen Collection (cultures, biopsies, blood, body fluids, etc.) []  - 0 Specimen(s) / Culture(s) sent or taken to Lab for analysis []  - 0 Patient Transfer (multiple staff / [employer] fast streamer / Similar devices) []  - 0 Simple Staple / Suture removal (25 or less) []  - 0 Complex Staple / Suture removal (26 or more) []  - 0 Hypo / Hyperglycemic Management (close monitor of Blood Glucose) []  - 0 Ankle / Brachial Index (ABI) - do not check if billed separately X- 1 5 Vital Signs Has the patient been seen at the hospital within the last three years: Yes Total Score: 80 Level Of Care: New/Established - Level 3 Electronic Signature(s) Signed: 08/31/2020 8:33:59 AM By: Angela Coria RN Entered By: [name] on 08/21/2020 10:54:55 [name] ([id number]) -------------------------------------------------------------------------------- Encounter Discharge Information Details Patient Name: [name] Date of Service: 08/21/2020 10:30 AM Medical Record Number: [id number] Patient Account Number: [id number] Date of Birth/Sex: 07-27-1980 (40 y.o. F) Treating RN: [name] Primary Care [name]: [name] Other Clinician: Referring [name]: [name] Treating Lindalee Huizinga/Extender: [name] in Treatment: 1 Encounter Discharge Information Items Discharge Condition: Stable Ambulatory Status: Ambulatory Discharge Destination: Home Transportation: Other Accompanied By: mom Schedule Follow-up Appointment: Yes Clinical  Summary of Care: Electronic Signature(s) Signed: 08/21/2020 4:26:14 PM By: [name] Entered By: [name] on 08/21/2020 11:06:17 [name] ([id number]) -------------------------------------------------------------------------------- Lower Extremity Assessment Details Patient Name: [name] Date of Service: 08/21/2020 10:30 AM Medical Record Number: [id number] Patient Account Number: [id number] Date of Birth/Sex: [birth year] (40 y.o. F) Treating RN: [name] Primary Care Obe Ahlers: [name] Other Clinician: Referring Almetta Liddicoat: [name] Treating Malli Falotico/Extender: Jeri Cos Weeks in Treatment: 1 Electronic Signature(s) Signed: 08/21/2020 3:59:56 PM By: Jeanine Luz Signed: 08/31/2020 8:33:59 AM By: Angela Coria RN Entered By: Jeanine Luz on 08/21/2020 10:45:38 Lorah, [name] ([id number]) -------------------------------------------------------------------------------- Multi Wound Chart Details Patient Name: [name] Date of Service: 08/21/2020 10:30 AM Medical Record Number: [id number] Patient Account Number: [id number] Date of Birth/Sex: 12/17/1980 (40 y.o. F) Treating RN: [name] Primary Care Lael Wetherbee: [name] Other Clinician: Referring Elyanah Farino: [name] Treating Quention Mcneill/Extender: [name] in Treatment: 1 Vital Signs Height(in): 67 Pulse(bpm): 78 Weight(lbs): 210 Blood Pressure(mmHg): 117/75 Body Mass Index(BMI): 33 Temperature(F): 98.6 Respiratory Rate(breaths/min): 18 Photos: [N/A:N/A] Wound Location: Right, Proximal Upper Leg N/A N/A Wounding Event: Surgical Injury N/A N/A Primary Etiology: Dehisced Wound N/A N/A Comorbid History: History of Burn, Osteoarthritis N/A N/A Date Acquired: 08/07/2020 N/A N/A Weeks of Treatment: 1 N/A N/A Wound Status: Open N/A N/A Measurements L x W x D (cm) 2.2x3x0.2 N/A N/A Area (cm) : 5.184 N/A N/A Volume (cm) : 1.037 N/A N/A % Reduction in Area: 43.40% N/A N/A %  Reduction in Volume: 43.40% N/A N/A Classification: Full Thickness Without Exposed N/A N/A Support Structures Exudate Amount: Medium N/A N/A Exudate Type: Serosanguineous N/A N/A Exudate Color: red, brown N/A N/A Wound Margin: Flat and Intact N/A N/A Granulation Amount: Large (67-100%) N/A N/A Granulation Quality: Red N/A N/A Necrotic Amount: Small (1-33%) N/A N/A Exposed Structures: Fat Layer (Subcutaneous Tissue): N/A N/A Yes Fascia: No Tendon: No Muscle: No Joint: No Bone: No  Treatment Notes Electronic Signature(s) Signed: 08/31/2020 8:33:59 AM By: Angela Coria RN Entered By: [name] on 08/21/2020 10:53:27 Dockery, [name] ([id number]) -------------------------------------------------------------------------------- Multi-Disciplinary Care Plan Details Patient Name: [name] Date of Service: 08/21/2020 10:30 AM Medical Record Number: [id number] Patient Account Number: [id number] Date of Birth/Sex: 10-Dec-1980 (40 y.o. F) Treating RN: [name] Primary Care Jalise Zawistowski: [name] Other Clinician: Referring Cortney Mckinney: [name] Treating Smiley Birr/Extender: [name] in Treatment: 1 Active Inactive Wound/Skin Impairment Nursing Diagnoses: Impaired tissue integrity Goals: Patient/caregiver will verbalize understanding of skin care regimen Date Initiated: 08/13/2020 Target Resolution Date: 09/12/2020 Goal Status: Active Ulcer/skin breakdown will have a volume reduction of 30% by week 4 Date Initiated: 08/13/2020 Target Resolution Date: 09/12/2020 Goal Status: Active Ulcer/skin breakdown will have a volume reduction of 50% by week 8 Date Initiated: 08/13/2020 Target Resolution Date: 10/13/2020 Goal Status: Active Ulcer/skin breakdown will have a volume reduction of 80% by week 12 Date Initiated: 08/13/2020 Target Resolution Date: 11/12/2020 Goal Status: Active Ulcer/skin breakdown will heal within 14 weeks Date Initiated: 08/13/2020 Target Resolution  Date: 12/13/2020 Goal Status: Active Interventions: Assess patient/caregiver ability to obtain necessary supplies Assess patient/caregiver ability to perform ulcer/skin care regimen upon admission and as needed Assess ulceration(s) every visit Provide education on ulcer and skin care Treatment Activities: Referred to DME Ramello Cordial for dressing supplies : 08/13/2020 Skin care regimen initiated : 08/13/2020 Notes: Electronic Signature(s) Signed: 08/31/2020 8:33:59 AM By: Angela Coria RN Entered By: [name] on 08/21/2020 10:53:08 Karstens, [name] ([id number]) -------------------------------------------------------------------------------- Pain Assessment Details Patient Name: [name] Date of Service: 08/21/2020 10:30 AM Medical Record Number: [id number] Patient Account Number: [id number] Date of Birth/Sex: [birth year] (40 y.o. F) Treating RN: [name] Primary Care Matelyn Antonelli: [name] Other Clinician: Referring Diondre Pulis: [name] Treating Tirsa Gail/Extender: [name] in Treatment: 1 Active Problems Location of Pain Severity and Description of Pain Patient Has Paino Yes Site Locations Rate the pain. Current Pain Level: 7 Pain Management and Medication Current Pain Management: Electronic Signature(s) Signed: 08/21/2020 3:59:56 PM By: Jeanine Luz Signed: 08/31/2020 8:33:59 AM By: Angela Coria RN Entered By: Jeanine Luz on 08/21/2020 10:40:57 Horacek, [name] ([id number]) -------------------------------------------------------------------------------- Patient/Caregiver Education Details Patient Name: [name] Date of Service: 08/21/2020 10:30 AM Medical Record Number: [id number] Patient Account Number: [id number] Date of Birth/Gender: 09/05/1980 (40 y.o. F) Treating RN: [name] Primary Care Physician: [name] Other Clinician: Referring Physician: Threasa Stone Treating Physician/Extender: [name] in Treatment:  1 Education Assessment Education Provided To: Patient Education Topics Provided Wound/Skin Impairment: Methods: Explain/Verbal Responses: State content correctly Electronic Signature(s) Signed: 08/31/2020 8:33:59 AM By: Angela Coria RN Entered By: [name] on 08/21/2020 10:55:12 Sopko, [name] ([id number]) -------------------------------------------------------------------------------- Wound Assessment Details Patient Name: [name] Date of Service: 08/21/2020 10:30 AM Medical Record Number: [id number] Patient Account Number: [id number] Date of Birth/Sex: 20-Aug-1980 (40 y.o. F) Treating RN: [name] Primary Care Floye Fesler: [name] Other Clinician: Referring Ziggy Reveles: [name] Treating Ilean Spradlin/Extender: Jeri Cos Weeks in Treatment: 1 Wound Status Wound Number: 1 Primary Etiology: Dehisced Wound Wound Location: Right, Proximal Upper Leg Wound Status: Open Wounding Event: Surgical Injury Comorbid History: History of Burn, Osteoarthritis Date Acquired: 08/07/2020 Weeks Of Treatment: 1 Clustered Wound: No Photos Wound Measurements Length: (cm) 2.2 Width: (cm) 3 Depth: (cm) 0.2 Area: (cm) 5.184 Volume: (cm) 1.037 % Reduction in Area: 43.4% % Reduction in Volume: 43.4% Tunneling: No Undermining: No Wound Description Classification: Full Thickness Without Exposed Support Structures Wound Margin: Flat and Intact  Exudate Amount: Medium Exudate Type: Serosanguineous Exudate Color: red, brown Foul Odor After Cleansing: No Slough/Fibrino Yes Wound Bed Granulation Amount: Large (67-100%) Exposed Structure Granulation Quality: Red Fascia Exposed: No Necrotic Amount: Small (1-33%) Fat Layer (Subcutaneous Tissue) Exposed: Yes Necrotic Quality: Adherent Slough Tendon Exposed: No Muscle Exposed: No Joint Exposed: No Bone Exposed: No Electronic Signature(s) Signed: 08/21/2020 3:59:56 PM By: Jeanine Luz Signed: 08/31/2020 8:33:59 AM By: Angela Coria RN Entered By: Jeanine Luz on 08/21/2020 10:45:29 Westrup, [name] ([id number]) -------------------------------------------------------------------------------- Vitals Details Patient Name: [name] Date of Service: 08/21/2020 10:30 AM Medical Record Number: [id number] Patient Account Number: [id number] Date of Birth/Sex: [birth year] (40 y.o. F) Treating RN: [name] Primary Care Adasha Boehme: [name] Other Clinician: Referring Bawi Lakins: [name] Treating Genae Strine/Extender: [name] in Treatment: 1 Vital Signs Time Taken: 10:37 Temperature (F): 98.6 Height (in): 67 Pulse (bpm): 81 Weight (lbs): 210 Respiratory Rate (breaths/min): 18 Body Mass Index (BMI): 32.9 Blood Pressure (mmHg): 117/75 Reference Range: 80 - 120 mg / dl Electronic Signature(s) Signed: 08/21/2020 3:59:56 PM By: Jeanine Luz Entered By: Jeanine Luz on 08/21/2020 10:40:49

## 2020-09-03 ENCOUNTER — Encounter: Payer: Medicare Other | Admitting: Physician Assistant

## 2020-09-03 ENCOUNTER — Other Ambulatory Visit: Payer: Self-pay

## 2020-09-03 DIAGNOSIS — T8131XA Disruption of external operation (surgical) wound, not elsewhere classified, initial encounter: Secondary | ICD-10-CM | POA: Diagnosis not present
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
## 2020-09-03 NOTE — Progress Notes (Signed)
MATTALYN, ANDEREGG ([id number]) Visit Report for 09/03/2020 Arrival Information Details Patient Name: [name], [name] Date of Service: 09/03/2020 8:15 AM Medical Record Number: [id number] Patient Account Number: [id number] Date of Birth/Sex: 1980-09-02 (40 y.o. F) Treating RN: Cornell Barman Primary Care Provider: Threasa Alpha Other Clinician: Referring Provider: Threasa Alpha Treating Provider/Extender: Skipper Cliche in Treatment: 3 Visit Information History Since Last Visit Has Dressing in Place as Prescribed: Yes Patient Arrived: Ambulatory Pain Present Now: No Arrival Time: 08:19 Accompanied By: self Transfer Assistance: None Patient Identification Verified: Yes Secondary Verification Process Completed: Yes Patient Has Alerts: Yes Patient Alerts: NOT diabetic Electronic Signature(s) Signed: 09/03/2020 3:03:54 PM By: Gretta Cool, BSN, RN, CWS, Kim RN, BSN Entered By: Gretta Cool, BSN, RN, CWS, Kim on 09/03/2020 08:19:50 Zuccaro, Vivianne Spence ([id number]) -------------------------------------------------------------------------------- Clinic Level of Care Assessment Details Patient Name: [name] Date of Service: 09/03/2020 8:15 AM Medical Record Number: [id number] Patient Account Number: [id number] Date of Birth/Sex: 29-Nov-1980 (40 y.o. F) Treating RN: Dolan Amen Primary Care Provider: Threasa Alpha Other Clinician: Referring Provider: Threasa Alpha Treating Provider/Extender: Skipper Cliche in Treatment: 3 Clinic Level of Care Assessment Items TOOL 4 Quantity Score X - Use when only an EandM is performed on FOLLOW-UP visit 1 0 ASSESSMENTS - Nursing Assessment / Reassessment X - Reassessment of Co-morbidities (includes updates in patient status) 1 10 X- 1 5 Reassessment of Adherence to Treatment Plan ASSESSMENTS - Wound and Skin Assessment / Reassessment X - Simple Wound Assessment / Reassessment - one wound 1 5 [] - 0 Complex Wound Assessment / Reassessment - multiple  wounds [] - 0 Dermatologic / Skin Assessment (not related to wound area) ASSESSMENTS - Focused Assessment [] - Circumferential Edema Measurements - multi extremities 0 [] - 0 Nutritional Assessment / Counseling / Intervention [] - 0 Lower Extremity Assessment (monofilament, tuning fork, pulses) [] - 0 Peripheral Arterial Disease Assessment (using hand held doppler) ASSESSMENTS - Ostomy and/or Continence Assessment and Care [] - Incontinence Assessment and Management 0 [] - 0 Ostomy Care Assessment and Management (repouching, etc.) PROCESS - Coordination of Care X - Simple Patient / Family Education for ongoing care 1 15 [] - 0 Complex (extensive) Patient / Family Education for ongoing care [] - 0 Staff obtains Programmer, systems, Records, Test Results / Process Orders [] - 0 Staff telephones HHA, Nursing Homes / Clarify orders / etc [] - 0 Routine Transfer to another Facility (non-emergent condition) [] - 0 Routine Hospital Admission (non-emergent condition) [] - 0 New Admissions / Biomedical engineer / Ordering NPWT, Apligraf, etc. [] - 0 Emergency Hospital Admission (emergent condition) X- 1 10 Simple Discharge Coordination [] - 0 Complex (extensive) Discharge Coordination PROCESS - Special Needs [] - Pediatric / Minor Patient Management 0 [] - 0 Isolation Patient Management [] - 0 Hearing / Language / Visual special needs [] - 0 Assessment of Community assistance (transportation, D/C planning, etc.) [] - 0 Additional assistance / Altered mentation [] - 0 Support Surface(s) Assessment (bed, cushion, seat, etc.) INTERVENTIONS - Wound Cleansing / Measurement Conrey, Zonnique T. ([id number]) X- 1 5 Simple Wound Cleansing - one wound [] - 0 Complex Wound Cleansing - multiple wounds X- 1 5 Wound Imaging (photographs - any number of wounds) [] - 0 Wound Tracing (instead of photographs) X- 1 5 Simple Wound Measurement - one wound [] - 0 Complex Wound Measurement -  multiple wounds INTERVENTIONS - Wound Dressings [] - Small Wound Dressing one or multiple wounds 0 X- 1 15 Medium  Wound Dressing one or multiple wounds [] - 0 Large Wound Dressing one or multiple wounds [] - 0 Application of Medications - topical [] - 0 Application of Medications - injection INTERVENTIONS - Miscellaneous [] - External ear exam 0 [] - 0 Specimen Collection (cultures, biopsies, blood, body fluids, etc.) [] - 0 Specimen(s) / Culture(s) sent or taken to Lab for analysis [] - 0 Patient Transfer (multiple staff / Civil Service fast streamer / Similar devices) [] - 0 Simple Staple / Suture removal (25 or less) [] - 0 Complex Staple / Suture removal (26 or more) [] - 0 Hypo / Hyperglycemic Management (close monitor of Blood Glucose) [] - 0 Ankle / Brachial Index (ABI) - do not check if billed separately X- 1 5 Vital Signs Has the patient been seen at the hospital within the last three years: Yes Total Score: 80 Level Of Care: New/Established - Level 3 Electronic Signature(s) Signed: 09/03/2020 11:56:40 AM By: Georges Mouse, Minus Breeding RN Entered By: Georges Mouse, Kenia on 09/03/2020 08:47:05 Bissette, Vivianne Spence ([id number]) -------------------------------------------------------------------------------- Encounter Discharge Information Details Patient Name: [name] Date of Service: 09/03/2020 8:15 AM Medical Record Number: [id number] Patient Account Number: [id number] Date of Birth/Sex: 09-04-1980 (40 y.o. F) Treating RN: Dolan Amen Primary Care Provider: Threasa Alpha Other Clinician: Referring Provider: Threasa Alpha Treating Provider/Extender: Skipper Cliche in Treatment: 3 Encounter Discharge Information Items Discharge Condition: Stable Ambulatory Status: Ambulatory Discharge Destination: Home Transportation: Private Auto Accompanied By: self Schedule Follow-up Appointment: Yes Clinical Summary of Care: Electronic Signature(s) Signed: 09/03/2020 5:05:23  PM By: Jeanine Luz Entered By: Jeanine Luz on 09/03/2020 08:56:48 Prokop, Vivianne Spence ([id number]) -------------------------------------------------------------------------------- Lower Extremity Assessment Details Patient Name: [name] Date of Service: 09/03/2020 8:15 AM Medical Record Number: [id number] Patient Account Number: [id number] Date of Birth/Sex: 01-03-1981 (40 y.o. F) Treating RN: Cornell Barman Primary Care Provider: Threasa Alpha Other Clinician: Referring Provider: Threasa Alpha Treating Provider/Extender: Skipper Cliche in Treatment: 3 Electronic Signature(s) Signed: 09/03/2020 3:03:54 PM By: Gretta Cool, BSN, RN, CWS, Kim RN, BSN Entered By: Gretta Cool, BSN, RN, CWS, Kim on 09/03/2020 08:23:33 Dehoyos, Vivianne Spence ([id number]) -------------------------------------------------------------------------------- Multi Wound Chart Details Patient Name: [name] Date of Service: 09/03/2020 8:15 AM Medical Record Number: [id number] Patient Account Number: [id number] Date of Birth/Sex: November 05, 1980 (40 y.o. F) Treating RN: Dolan Amen Primary Care Provider: Threasa Alpha Other Clinician: Referring Provider: Threasa Alpha Treating Provider/Extender: Skipper Cliche in Treatment: 3 Vital Signs Height(in): 3 Pulse(bpm): 60 Weight(lbs): 210 Blood Pressure(mmHg): 108/65 Body Mass Index(BMI): 33 Temperature(F): 98.4 Respiratory Rate(breaths/min): 16 Photos: [N/A:N/A] Wound Location: Right, Proximal Upper Leg N/A N/A Wounding Event: Surgical Injury N/A N/A Primary Etiology: Dehisced Wound N/A N/A Comorbid History: History of Burn, Osteoarthritis N/A N/A Date Acquired: 08/07/2020 N/A N/A Weeks of Treatment: 3 N/A N/A Wound Status: Open N/A N/A Measurements L x W x D (cm) 1.5x1.1x0.1 N/A N/A Area (cm) : 1.296 N/A N/A Volume (cm) : 0.13 N/A N/A % Reduction in Area: 85.80% N/A N/A % Reduction in Volume: 92.90% N/A N/A Classification: Full Thickness Without  Exposed N/A N/A Support Structures Exudate Amount: Medium N/A N/A Exudate Type: Serosanguineous N/A N/A Exudate Color: red, brown N/A N/A Wound Margin: Flat and Intact N/A N/A Granulation Amount: Large (67-100%) N/A N/A Granulation Quality: Red N/A N/A Necrotic Amount: Small (1-33%) N/A N/A Exposed Structures: Fat Layer (Subcutaneous Tissue): N/A N/A Yes Fascia: No Tendon: No Muscle: No Joint: No Bone: No Epithelialization: Small (1-33%) N/A N/A Treatment Notes Electronic Signature(s) Signed: 09/03/2020 11:56:40  AM By: Georges Mouse, Minus Breeding RN Entered By: Georges Mouse, Minus Breeding on 09/03/2020 08:45:30 Cottrell, Vivianne Spence ([id number]) -------------------------------------------------------------------------------- Multi-Disciplinary Care Plan Details Patient Name: [name] Date of Service: 09/03/2020 8:15 AM Medical Record Number: [id number] Patient Account Number: [id number] Date of Birth/Sex: 19-Apr-1980 (40 y.o. F) Treating RN: Dolan Amen Primary Care Eustolia Drennen: Threasa Alpha Other Clinician: Referring Tinaya Ceballos: Threasa Alpha Treating Demeka Sutter/Extender: Skipper Cliche in Treatment: 3 Active Inactive Wound/Skin Impairment Nursing Diagnoses: Impaired tissue integrity Goals: Patient/caregiver will verbalize understanding of skin care regimen Date Initiated: 08/13/2020 Date Inactivated: 09/03/2020 Target Resolution Date: 09/12/2020 Goal Status: Met Ulcer/skin breakdown will have a volume reduction of 30% by week 4 Date Initiated: 08/13/2020 Date Inactivated: 09/03/2020 Target Resolution Date: 09/12/2020 Goal Status: Met Ulcer/skin breakdown will have a volume reduction of 50% by week 8 Date Initiated: 08/13/2020 Target Resolution Date: 10/13/2020 Goal Status: Active Ulcer/skin breakdown will have a volume reduction of 80% by week 12 Date Initiated: 08/13/2020 Target Resolution Date: 11/12/2020 Goal Status: Active Ulcer/skin breakdown will heal within 14 weeks Date  Initiated: 08/13/2020 Target Resolution Date: 12/13/2020 Goal Status: Active Interventions: Assess patient/caregiver ability to obtain necessary supplies Assess patient/caregiver ability to perform ulcer/skin care regimen upon admission and as needed Assess ulceration(s) every visit Provide education on ulcer and skin care Treatment Activities: Referred to DME Skyelar Halliday for dressing supplies : 08/13/2020 Skin care regimen initiated : 08/13/2020 Notes: Electronic Signature(s) Signed: 09/03/2020 11:56:40 AM By: Georges Mouse, Minus Breeding RN Entered By: Georges Mouse, Minus Breeding on 09/03/2020 08:45:24 Oleksy, Vivianne Spence ([id number]) -------------------------------------------------------------------------------- Pain Assessment Details Patient Name: [name] Date of Service: 09/03/2020 8:15 AM Medical Record Number: [id number] Patient Account Number: [id number] Date of Birth/Sex: 1980-11-22 (40 y.o. F) Treating RN: Cornell Barman Primary Care Alissandra Geoffroy: Threasa Alpha Other Clinician: Referring Adrienna Karis: Threasa Alpha Treating Priscilla Finklea/Extender: Skipper Cliche in Treatment: 3 Active Problems Location of Pain Severity and Description of Pain Patient Has Paino Yes Site Locations Rate the pain. Current Pain Level: 6 Pain Management and Medication Current Pain Management: Electronic Signature(s) Signed: 09/03/2020 3:03:54 PM By: Gretta Cool, BSN, RN, CWS, Kim RN, BSN Entered By: Gretta Cool, BSN, RN, CWS, Kim on 09/03/2020 08:20:40 Colt, Vivianne Spence ([id number]) -------------------------------------------------------------------------------- Patient/Caregiver Education Details Patient Name: [name] Date of Service: 09/03/2020 8:15 AM Medical Record Number: [id number] Patient Account Number: [id number] Date of Birth/Gender: Jun 02, 1980 (40 y.o. F) Treating RN: Dolan Amen Primary Care Physician: Threasa Alpha Other Clinician: Referring Physician: Threasa Alpha Treating Physician/Extender:  Skipper Cliche in Treatment: 3 Education Assessment Education Provided To: Patient Education Topics Provided Wound/Skin Impairment: Methods: Explain/Verbal Responses: State content correctly Electronic Signature(s) Signed: 09/03/2020 11:56:40 AM By: Georges Mouse, Minus Breeding RN Entered By: Georges Mouse, Minus Breeding on 09/03/2020 08:47:18 Brandis, Vivianne Spence ([id number]) -------------------------------------------------------------------------------- Wound Assessment Details Patient Name: [name] Date of Service: 09/03/2020 8:15 AM Medical Record Number: [id number] Patient Account Number: [id number] Date of Birth/Sex: [birth year] (40 y.o. F) Treating RN: Cornell Barman Primary Care Jackqulyn Mendel: Threasa Alpha Other Clinician: Referring Alexzander Dolinger: Threasa Alpha Treating Addalynn Kumari/Extender: Jeri Cos Weeks in Treatment: 3 Wound Status Wound Number: 1 Primary Etiology: Dehisced Wound Wound Location: Right, Proximal Upper Leg Wound Status: Open Wounding Event: Surgical Injury Comorbid History: History of Burn, Osteoarthritis Date Acquired: 08/07/2020 Weeks Of Treatment: 3 Clustered Wound: No Photos Wound Measurements Length: (cm) 1.5 Width: (cm) 1.1 Depth: (cm) 0.1 Area: (cm) 1.296 Volume: (cm) 0.13 % Reduction in Area: 85.8% % Reduction in Volume: 92.9% Epithelialization: Small (1-33%) Tunneling: No Undermining: No Wound Description Classification: Full Thickness Without  Exposed Support Structures Wound Margin: Flat and Intact Exudate Amount: Medium Exudate Type: Serosanguineous Exudate Color: red, brown Foul Odor After Cleansing: No Slough/Fibrino Yes Wound Bed Granulation Amount: Large (67-100%) Exposed Structure Granulation Quality: Red Fascia Exposed: No Necrotic Amount: Small (1-33%) Fat Layer (Subcutaneous Tissue) Exposed: Yes Necrotic Quality: Adherent Slough Tendon Exposed: No Muscle Exposed: No Joint Exposed: No Bone Exposed: No Treatment Notes Wound #1  (Upper Leg) Wound Laterality: Right, Proximal Cleanser Soap and Water Discharge Instruction: Gently cleanse wound with antibacterial soap, rinse and pat dry prior to dressing wounds Peri-Wound Care Toothaker, BRITNIE [place]LE ([id number]) Topical Primary Dressing Hydrofera Blue Ready Transfer Foam, 2.5x2.5 (in/in) Discharge Instruction: Apply Hydrofera Blue Ready to wound bed as directed Secondary Dressing Secured With Tegaderm Film 4x4 (in/in) Discharge Instruction: Apply to wound bed Compression Wrap Compression Stockings Add-Ons Electronic Signature(s) Signed: 09/03/2020 3:03:54 PM By: Gretta Cool, BSN, RN, CWS, Kim RN, BSN Entered By: Gretta Cool, BSN, RN, CWS, Kim on 09/03/2020 08:23:21 Leas, Vivianne Spence ([id number]) -------------------------------------------------------------------------------- Nance Details Patient Name: [name] Date of Service: 09/03/2020 8:15 AM Medical Record Number: [id number] Patient Account Number: [id number] Date of Birth/Sex: December 25, 1980 (40 y.o. F) Treating RN: Cornell Barman Primary Care Lockie Bothun: Threasa Alpha Other Clinician: Referring Freya Zobrist: Threasa Alpha Treating Alexanderia Gorby/Extender: Skipper Cliche in Treatment: 3 Vital Signs Time Taken: 08:19 Temperature (F): 98.4 Height (in): 67 Pulse (bpm): 60 Weight (lbs): 210 Respiratory Rate (breaths/min): 16 Body Mass Index (BMI): 32.9 Blood Pressure (mmHg): 108/65 Reference Range: 80 - 120 mg / dl Electronic Signature(s) Signed: 09/03/2020 3:03:54 PM By: Gretta Cool, BSN, RN, CWS, Kim RN, BSN Entered By: Gretta Cool, BSN, RN, CWS, Kim on 09/03/2020 08:20:29

## 2020-09-04 ENCOUNTER — Ambulatory Visit: Payer: Medicaid Other | Admitting: Physician Assistant

## 2020-09-11 ENCOUNTER — Other Ambulatory Visit: Payer: Self-pay

## 2020-09-11 ENCOUNTER — Encounter: Payer: Medicare Other | Admitting: Internal Medicine

## 2020-09-11 DIAGNOSIS — T8131XA Disruption of external operation (surgical) wound, not elsewhere classified, initial encounter: Secondary | ICD-10-CM | POA: Diagnosis not present
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
## 2020-09-15 NOTE — Progress Notes (Signed)
Angela, Stone ([id number]) Visit Report for 09/11/2020 Arrival Information Details Patient Name: [name], [name] Date of Service: 09/11/2020 10:15 AM Medical Record Number: [id number] Patient Account Number: [id number] Date of Birth/Sex: 05/17/1980 (40 y.o. F) Treating RN: Dolan Amen Primary Care Ruthellen Tippy: Threasa Alpha Other Clinician: Referring Shataria Crist: Threasa Alpha Treating Lateesha Bezold/Extender: Tito Dine in Treatment: 4 Visit Information History Since Last Visit Pain Present Now: No Patient Arrived: Ambulatory Arrival Time: 10:11 Accompanied By: self Transfer Assistance: None Patient Identification Verified: Yes Secondary Verification Process Completed: Yes Patient Has Alerts: Yes Patient Alerts: NOT diabetic Electronic Signature(s) Signed: 09/11/2020 4:05:26 PM By: Georges Mouse, Minus Breeding RN Entered By: Georges Mouse, Minus Breeding on 09/11/2020 10:12:06 Bartek, Vivianne Spence ([id number]) -------------------------------------------------------------------------------- Clinic Level of Care Assessment Details Patient Name: [name] Date of Service: 09/11/2020 10:15 AM Medical Record Number: [id number] Patient Account Number: [id number] Date of Birth/Sex: 04-22-1980 (40 y.o. F) Treating RN: Carlene Coria Primary Care Baine Decesare: Threasa Alpha Other Clinician: Referring Nawaal Alling: Threasa Alpha Treating Mililani Murthy/Extender: Tito Dine in Treatment: 4 Clinic Level of Care Assessment Items TOOL 4 Quantity Score X - Use when only an EandM is performed on FOLLOW-UP visit 1 0 ASSESSMENTS - Nursing Assessment / Reassessment _0  - Reassessment of Co-morbidities (includes updates in patient status) 0 _1  - 0 Reassessment of Adherence to Treatment Plan ASSESSMENTS - Wound and Skin Assessment / Reassessment X - Simple Wound Assessment / Reassessment - one wound 1 5 _2  - 0 Complex Wound Assessment / Reassessment - multiple wounds _3  - 0 Dermatologic / Skin  Assessment (not related to wound area) ASSESSMENTS - Focused Assessment _4  - Circumferential Edema Measurements - multi extremities 0 _5  - 0 Nutritional Assessment / Counseling / Intervention _6  - 0 Lower Extremity Assessment (monofilament, tuning fork, pulses) _7  - 0 Peripheral Arterial Disease Assessment (using hand held doppler) ASSESSMENTS - Ostomy and/or Continence Assessment and Care _8  - Incontinence Assessment and Management 0 _9  - 0 Ostomy Care Assessment and Management (repouching, etc.) PROCESS - Coordination of Care X - Simple Patient / Family Education for ongoing care 1 15 _10  - 0 Complex (extensive) Patient / Family Education for ongoing care X- 1 10 Staff obtains Programmer, systems, Records, Test Results / Process Orders _11  - 0 Staff telephones HHA, Nursing Homes / Clarify orders / etc _12  - 0 Routine Transfer to another Facility (non-emergent condition) _13  - 0 Routine Hospital Admission (non-emergent condition) _14  - 0 New Admissions / Biomedical engineer / Ordering NPWT, Apligraf, etc. _15  - 0 Emergency Hospital Admission (emergent condition) X- 1 10 Simple Discharge Coordination _16  - 0 Complex (extensive) Discharge Coordination PROCESS - Special Needs _17  - Pediatric / Minor Patient Management 0 _18  - 0 Isolation Patient Management _19  - 0 Hearing / Language / Visual special needs _20  - 0 Assessment of Community assistance (transportation, D/C planning, etc.) _21  - 0 Additional assistance / Altered mentation _22  - 0 Support Surface(s) Assessment (bed, cushion, seat, etc.) INTERVENTIONS - Wound Cleansing / Measurement Ciaramitaro, Anica T. ([id number]) X- 1 5 Simple Wound Cleansing - one wound _23  - 0 Complex Wound Cleansing - multiple wounds X- 1 5 Wound Imaging (photographs - any number of wounds) _24  - 0 Wound Tracing (instead of photographs) X- 1 5 Simple Wound Measurement - one wound _25  - 0 Complex Wound Measurement - multiple wounds INTERVENTIONS - Wound  Dressings X - Small Wound Dressing one or multiple wounds 1 10 _26  - 0 Medium Wound Dressing one or multiple wounds _27  - 0 Large  Wound Dressing one or multiple wounds X- 1 5 Application of Medications - topical <YBOFBPZWCHENIDPO>_2<\/UMPNTIRWERXVQMGQ>_6  - 0 Application of Medications - injection INTERVENTIONS - Miscellaneous _1  - External ear exam 0 _2  - 0 Specimen Collection (cultures, biopsies, blood, body fluids, etc.) _3  - 0 Specimen(s) / Culture(s) sent or taken to Lab for analysis _4  - 0 Patient Transfer (multiple staff / Harrel Lemon Lift / Similar devices) _5  - 0 Simple Staple / Suture removal (25 or less) _6  - 0 Complex Staple / Suture removal (26 or more) _7  - 0 Hypo / Hyperglycemic Management (close monitor of Blood Glucose) _8  - 0 Ankle / Brachial Index (ABI) - do not check if billed separately X- 1 5 Vital Signs Has the patient been seen at the hospital within the last three years: Yes Total Score: 75 Level Of Care: New/Established - Level 2 Electronic Signature(s) Signed: 09/15/2020 3:50:09 PM By: Carlene Coria RN Entered By: Carlene Coria on 09/11/2020 10:20:21 [name] ([id number]) -------------------------------------------------------------------------------- Encounter Discharge Information Details Patient Name: [name] Date of Service: 09/11/2020 10:15 AM Medical Record Number: [id number] Patient Account Number: [id number] Date of Birth/Sex: [birth year] (40 y.o. F) Treating RN: Carlene Coria Primary Care Clevon Khader: Threasa Alpha Other Clinician: Referring Lathyn Griggs: Threasa Alpha Treating Jaleena Viviani/Extender: Tito Dine in Treatment: 4 Encounter Discharge Information Items Discharge Condition: Stable Ambulatory Status: Ambulatory Discharge Destination: Home Transportation: Private Auto Accompanied By: self Schedule Follow-up Appointment: Yes Clinical Summary of Care: Patient Declined Electronic Signature(s) Signed: 09/15/2020 3:50:09 PM By: Carlene Coria RN Entered By:  Carlene Coria on 09/11/2020 10:26:53 Urich, Vivianne Spence ([id number]) -------------------------------------------------------------------------------- Lower Extremity Assessment Details Patient Name: [name] Date of Service: 09/11/2020 10:15 AM Medical Record Number: [id number] Patient Account Number: [id number] Date of Birth/Sex: [birth year] (40 y.o. F) Treating RN: Dolan Amen Primary Care Vernestine Brodhead: Threasa Alpha Other Clinician: Referring Bianey Tesoro: Threasa Alpha Treating Lusine Corlett/Extender: Tito Dine in Treatment: 4 Electronic Signature(s) Signed: 09/11/2020 4:05:26 PM By: Georges Mouse, Minus Breeding RN Entered By: Georges Mouse, Minus Breeding on 09/11/2020 10:16:52 Leiter, Vivianne Spence ([id number]) -------------------------------------------------------------------------------- Multi Wound Chart Details Patient Name: [name] Date of Service: 09/11/2020 10:15 AM Medical Record Number: [id number] Patient Account Number: [id number] Date of Birth/Sex: 1980/05/22 (40 y.o. F) Treating RN: Carlene Coria Primary Care Eliyah Bazzi: Threasa Alpha Other Clinician: Referring Detron Carras: Threasa Alpha Treating Glennis Montenegro/Extender: Tito Dine in Treatment: 4 Vital Signs Height(in): 67 Pulse(bpm): 77 Weight(lbs): 210 Blood Pressure(mmHg): 122/75 Body Mass Index(BMI): 33 Temperature(F): 98.5 Respiratory Rate(breaths/min): 16 Photos: [N/A:N/A] Wound Location: Right, Proximal Upper Leg N/A N/A Wounding Event: Surgical Injury N/A N/A Primary Etiology: Dehisced Wound N/A N/A Comorbid History: History of Burn, Osteoarthritis N/A N/A Date Acquired: 08/07/2020 N/A N/A Weeks of Treatment: 4 N/A N/A Wound Status: Open N/A N/A Measurements L x W x D (cm) 0.4x0.5x0.1 N/A N/A Area (cm) : 0.157 N/A N/A Volume (cm) : 0.016 N/A N/A % Reduction in Area: 98.30% N/A N/A % Reduction in Volume: 99.10% N/A N/A Classification: Full Thickness Without Exposed N/A N/A Support  Structures Exudate Amount: Medium N/A N/A Exudate Type: Sanguinous N/A N/A Exudate Color: red N/A N/A Wound Margin: Flat and Intact N/A N/A Granulation Amount: Large (67-100%) N/A N/A Granulation Quality: Red N/A N/A Necrotic Amount: None Present (0%) N/A N/A Exposed Structures: Fat Layer (Subcutaneous Tissue): N/A N/A Yes Fascia: No Tendon: No Muscle: No Joint: No Bone: No Epithelialization: Large (67-100%) N/A N/A Treatment Notes Electronic Signature(s) Signed: 09/11/2020 4:35:25 PM By: Linton Ham MD Entered By: Linton Ham on 09/11/2020  10:24:09 GEORGINA, KRIST ([id number]) -------------------------------------------------------------------------------- [place] Details Patient Name: NILAYA, BOUIE Date of Service: 09/11/2020 10:15 AM Medical Record Number: [id number] Patient Account Number: [id number] Date of Birth/Sex: Apr 07, 1981 (40 y.o. F) Treating RN: Carlene Coria Primary Care Briane Birden: Threasa Alpha Other Clinician: Referring Alen Matheson: Threasa Alpha Treating Derita Michelsen/Extender: Tito Dine in Treatment: 4 Active Inactive Wound/Skin Impairment Nursing Diagnoses: Impaired tissue integrity Goals: Patient/caregiver will verbalize understanding of skin care regimen Date Initiated: 08/13/2020 Date Inactivated: 09/03/2020 Target Resolution Date: 09/12/2020 Goal Status: Met Ulcer/skin breakdown will have a volume reduction of 30% by week 4 Date Initiated: 08/13/2020 Date Inactivated: 09/03/2020 Target Resolution Date: 09/12/2020 Goal Status: Met Ulcer/skin breakdown will have a volume reduction of 50% by week 8 Date Initiated: 08/13/2020 Target Resolution Date: 10/13/2020 Goal Status: Active Ulcer/skin breakdown will have a volume reduction of 80% by week 12 Date Initiated: 08/13/2020 Target Resolution Date: 11/12/2020 Goal Status: Active Ulcer/skin breakdown will heal within 14 weeks Date Initiated: 08/13/2020 Target Resolution  Date: 12/13/2020 Goal Status: Active Interventions: Assess patient/caregiver ability to obtain necessary supplies Assess patient/caregiver ability to perform ulcer/skin care regimen upon admission and as needed Assess ulceration(s) every visit Provide education on ulcer and skin care Treatment Activities: Referred to DME Oliverio Cho for dressing supplies : 08/13/2020 Skin care regimen initiated : 08/13/2020 Notes: Electronic Signature(s) Signed: 09/15/2020 3:50:09 PM By: Carlene Coria RN Entered By: Carlene Coria on 09/11/2020 10:18:55 Guise, Vivianne Spence ([id number]) -------------------------------------------------------------------------------- Pain Assessment Details Patient Name: [name] Date of Service: 09/11/2020 10:15 AM Medical Record Number: [id number] Patient Account Number: [id number] Date of Birth/Sex: February 15, 1981 (40 y.o. F) Treating RN: Dolan Amen Primary Care Truth Wolaver: Threasa Alpha Other Clinician: Referring Anyah Swallow: Threasa Alpha Treating Penelope Fittro/Extender: Tito Dine in Treatment: 4 Active Problems Location of Pain Severity and Description of Pain Patient Has Paino No Site Locations Rate the pain. Current Pain Level: 0 Pain Management and Medication Current Pain Management: Electronic Signature(s) Signed: 09/11/2020 4:05:26 PM By: Georges Mouse, Minus Breeding RN Entered By: Georges Mouse, Kenia on 09/11/2020 10:13:45 Piatt, Vivianne Spence ([id number]) -------------------------------------------------------------------------------- Patient/Caregiver Education Details Patient Name: [name] Date of Service: 09/11/2020 10:15 AM Medical Record Number: [id number] Patient Account Number: [id number] Date of Birth/Gender: 1980-08-20 (40 y.o. F) Treating RN: Carlene Coria Primary Care Physician: Threasa Alpha Other Clinician: Referring Physician: Threasa Alpha Treating Physician/Extender: Tito Dine in Treatment: 4 Education  Assessment Education Provided To: Patient Education Topics Provided Wound/Skin Impairment: Methods: Explain/Verbal Responses: State content correctly Electronic Signature(s) Signed: 09/15/2020 3:50:09 PM By: Carlene Coria RN Entered By: Carlene Coria on 09/11/2020 10:20:40 Dollens, Vivianne Spence ([id number]) -------------------------------------------------------------------------------- Wound Assessment Details Patient Name: [name] Date of Service: 09/11/2020 10:15 AM Medical Record Number: [id number] Patient Account Number: [id number] Date of Birth/Sex: 05-Oct-1980 (40 y.o. F) Treating RN: Dolan Amen Primary Care Xoie Kreuser: Threasa Alpha Other Clinician: Referring Shuayb Schepers: Threasa Alpha Treating Cable Fearn/Extender: Tito Dine in Treatment: 4 Wound Status Wound Number: 1 Primary Etiology: Dehisced Wound Wound Location: Right, Proximal Upper Leg Wound Status: Open Wounding Event: Surgical Injury Comorbid History: History of Burn, Osteoarthritis Date Acquired: 08/07/2020 Weeks Of Treatment: 4 Clustered Wound: No Photos Wound Measurements Length: (cm) 0.4 Width: (cm) 0.5 Depth: (cm) 0.1 Area: (cm) 0.157 Volume: (cm) 0.016 % Reduction in Area: 98.3% % Reduction in Volume: 99.1% Epithelialization: Large (67-100%) Tunneling: No Undermining: No Wound Description Classification: Full Thickness Without Exposed Support Structu Wound Margin: Flat and Intact Exudate Amount: Medium Exudate Type: Sanguinous Exudate Color: red res  Foul Odor After Cleansing: No Slough/Fibrino No Wound Bed Granulation Amount: Large (67-100%) Exposed Structure Granulation Quality: Red Fascia Exposed: No Necrotic Amount: None Present (0%) Fat Layer (Subcutaneous Tissue) Exposed: Yes Tendon Exposed: No Muscle Exposed: No Joint Exposed: No Bone Exposed: No Treatment Notes Wound #1 (Upper Leg) Wound Laterality: Right, Proximal Cleanser Soap and Water Discharge Instruction:  Gently cleanse wound with antibacterial soap, rinse and pat dry prior to dressing wounds Peri-Wound Care Tanzi, DESHANTI ADCOX ([id number]) Topical Primary Dressing Hydrofera Blue Ready Transfer Foam, 2.5x2.5 (in/in) Discharge Instruction: Apply Hydrofera Blue Ready to wound bed as directed Secondary Dressing Secured With Tegaderm Film 4x4 (in/in) Discharge Instruction: Apply to wound bed Compression Wrap Compression Stockings Add-Ons Electronic Signature(s) Signed: 09/11/2020 4:05:26 PM By: Georges Mouse, Minus Breeding RN Entered By: Georges Mouse, Minus Breeding on 09/11/2020 10:15:38 Genther, Vivianne Spence ([id number]) -------------------------------------------------------------------------------- Vitals Details Patient Name: [name] Date of Service: 09/11/2020 10:15 AM Medical Record Number: [id number] Patient Account Number: [id number] Date of Birth/Sex: 14-Mar-1981 (40 y.o. F) Treating RN: Dolan Amen Primary Care Kallista Pae: Threasa Alpha Other Clinician: Referring Francisco Eyerly: Threasa Alpha Treating Kawana Hegel/Extender: Tito Dine in Treatment: 4 Vital Signs Time Taken: 10:12 Temperature (F): 98.5 Height (in): 67 Pulse (bpm): 77 Weight (lbs): 210 Respiratory Rate (breaths/min): 16 Body Mass Index (BMI): 32.9 Blood Pressure (mmHg): 122/75 Reference Range: 80 - 120 mg / dl Electronic Signature(s) Signed: 09/11/2020 4:05:26 PM By: Georges Mouse, Minus Breeding RN Entered By: Georges Mouse, Minus Breeding on 09/11/2020 10:13:25

## 2020-09-18 ENCOUNTER — Encounter: Payer: Medicare Other | Attending: Physician Assistant | Admitting: Physician Assistant

## 2020-09-18 ENCOUNTER — Other Ambulatory Visit: Payer: Self-pay

## 2020-09-18 DIAGNOSIS — L97118 Non-pressure chronic ulcer of right thigh with other specified severity: Secondary | ICD-10-CM | POA: Insufficient documentation

## 2020-09-18 DIAGNOSIS — T8131XA Disruption of external operation (surgical) wound, not elsewhere classified, initial encounter: Secondary | ICD-10-CM | POA: Diagnosis not present

## 2020-09-18 NOTE — Progress Notes (Addendum)
Angela, [name] ([id number]) Visit Report for 09/18/2020 Arrival Information Details Patient Name: [name], [name] Date of Service: 09/18/2020 10:15 AM Medical Record Number: [id number] Patient Account Number: [id number] Date of Birth/Sex: 1980/07/28 (40 y.o. F) Treating RN: Donnamarie Poag Primary Care Benita Boonstra: Threasa Alpha Other Clinician: Referring Paige Monarrez: Threasa Alpha Treating Zilah Villaflor/Extender: Skipper Cliche in Treatment: 5 Visit Information History Since Last Visit Added or deleted any medications: No Patient Arrived: Ambulatory Had a fall or experienced change in No Arrival Time: 10:17 activities of daily living that may affect Accompanied By: mother risk of falls: Transfer Assistance: None Hospitalized since last visit: No Patient Identification Verified: Yes Has Dressing in Place as Prescribed: Yes Secondary Verification Process Completed: Yes Pain Present Now: No Patient Has Alerts: Yes Patient Alerts: NOT diabetic Electronic Signature(s) Signed: 09/18/2020 12:04:49 PM By: Donnamarie Poag Entered By: Donnamarie Poag on 09/18/2020 10:18:06 Batterton, Vivianne Spence ([id number]) -------------------------------------------------------------------------------- Clinic Level of Care Assessment Details Patient Name: [name] Date of Service: 09/18/2020 10:15 AM Medical Record Number: [id number] Patient Account Number: [id number] Date of Birth/Sex: [birth year] (40 y.o. F) Treating RN: Carlene Coria Primary Care Myya Meenach: Threasa Alpha Other Clinician: Referring Sloka Volante: Threasa Alpha Treating Leobardo Granlund/Extender: Skipper Cliche in Treatment: 5 Clinic Level of Care Assessment Items TOOL 4 Quantity Score X - Use when only an EandM is performed on FOLLOW-UP visit 1 0 ASSESSMENTS - Nursing Assessment / Reassessment X - Reassessment of Co-morbidities (includes updates in patient status) 1 10 X- 1 5 Reassessment of Adherence to Treatment Plan ASSESSMENTS - Wound and Skin Assessment  / Reassessment X - Simple Wound Assessment / Reassessment - one wound 1 5 []  - 0 Complex Wound Assessment / Reassessment - multiple wounds []  - 0 Dermatologic / Skin Assessment (not related to wound area) ASSESSMENTS - Focused Assessment []  - Circumferential Edema Measurements - multi extremities 0 []  - 0 Nutritional Assessment / Counseling / Intervention []  - 0 Lower Extremity Assessment (monofilament, tuning fork, pulses) []  - 0 Peripheral Arterial Disease Assessment (using hand held doppler) ASSESSMENTS - Ostomy and/or Continence Assessment and Care []  - Incontinence Assessment and Management 0 []  - 0 Ostomy Care Assessment and Management (repouching, etc.) PROCESS - Coordination of Care X - Simple Patient / Family Education for ongoing care 1 15 []  - 0 Complex (extensive) Patient / Family Education for ongoing care []  - 0 Staff obtains Programmer, systems, Records, Test Results / Process Orders []  - 0 Staff telephones HHA, Nursing Homes / Clarify orders / etc []  - 0 Routine Transfer to another Facility (non-emergent condition) []  - 0 Routine Hospital Admission (non-emergent condition) []  - 0 New Admissions / Biomedical engineer / Ordering NPWT, Apligraf, etc. []  - 0 Emergency Hospital Admission (emergent condition) X- 1 10 Simple Discharge Coordination []  - 0 Complex (extensive) Discharge Coordination PROCESS - Special Needs []  - Pediatric / Minor Patient Management 0 []  - 0 Isolation Patient Management []  - 0 Hearing / Language / Visual special needs []  - 0 Assessment of Community assistance (transportation, D/C planning, etc.) []  - 0 Additional assistance / Altered mentation []  - 0 Support Surface(s) Assessment (bed, cushion, seat, etc.) INTERVENTIONS - Wound Cleansing / Measurement Sankey, Venisha T. ([id number]) X- 1 5 Simple Wound Cleansing - one wound []  - 0 Complex Wound Cleansing - multiple wounds X- 1 5 Wound Imaging (photographs - any number of wounds) []   - 0 Wound Tracing (instead of photographs) X- 1 5 Simple Wound Measurement - one wound []  - 0 Complex Wound Measurement -  multiple wounds INTERVENTIONS - Wound Dressings []  - Small Wound Dressing one or multiple wounds 0 []  - 0 Medium Wound Dressing one or multiple wounds []  - 0 Large Wound Dressing one or multiple wounds []  - 0 Application of Medications - topical []  - 0 Application of Medications - injection INTERVENTIONS - Miscellaneous []  - External ear exam 0 []  - 0 Specimen Collection (cultures, biopsies, blood, body fluids, etc.) []  - 0 Specimen(s) / Culture(s) sent or taken to Lab for analysis []  - 0 Patient Transfer (multiple staff / Civil Service fast streamer / Similar devices) []  - 0 Simple Staple / Suture removal (25 or less) []  - 0 Complex Staple / Suture removal (26 or more) []  - 0 Hypo / Hyperglycemic Management (close monitor of Blood Glucose) []  - 0 Ankle / Brachial Index (ABI) - do not check if billed separately X- 1 5 Vital Signs Has the patient been seen at the hospital within the last three years: Yes Total Score: 65 Level Of Care: New/Established - Level 2 Electronic Signature(s) Signed: 09/22/2020 8:01:20 AM By: Carlene Coria RN Entered By: Carlene Coria on 09/18/2020 10:26:00 Magallanes, Vivianne Spence ([id number]) -------------------------------------------------------------------------------- Encounter Discharge Information Details Patient Name: [name] Date of Service: 09/18/2020 10:15 AM Medical Record Number: [id number] Patient Account Number: [id number] Date of Birth/Sex: 23-Aug-1980 (40 y.o. F) Treating RN: Carlene Coria Primary Care Lopaka Karge: Threasa Alpha Other Clinician: Referring Anshi Jalloh: Threasa Alpha Treating Isma Tietje/Extender: Skipper Cliche in Treatment: 5 Encounter Discharge Information Items Discharge Condition: Stable Ambulatory Status: Ambulatory Discharge Destination: Home Transportation: Private Auto Accompanied By: self Schedule  Follow-up Appointment: Yes Clinical Summary of Care: Patient Declined Electronic Signature(s) Signed: 09/22/2020 8:01:20 AM By: Carlene Coria RN Entered By: Carlene Coria on 09/18/2020 10:29:22 [name] ([id number]) -------------------------------------------------------------------------------- Lower Extremity Assessment Details Patient Name: [name] Date of Service: 09/18/2020 10:15 AM Medical Record Number: [id number] Patient Account Number: [id number] Date of Birth/Sex: Nov 05, 1980 (40 y.o. F) Treating RN: Donnamarie Poag Primary Care Zully Frane: Threasa Alpha Other Clinician: Referring Quientin Jent: Threasa Alpha Treating Kenitra Leventhal/Extender: Jeri Cos Weeks in Treatment: 5 Electronic Signature(s) Signed: 09/18/2020 12:04:49 PM By: Donnamarie Poag Entered By: Donnamarie Poag on 09/18/2020 10:21:31 Franko, Vivianne Spence ([id number]) -------------------------------------------------------------------------------- Multi Wound Chart Details Patient Name: [name] Date of Service: 09/18/2020 10:15 AM Medical Record Number: [id number] Patient Account Number: [id number] Date of Birth/Sex: [birth year] (40 y.o. F) Treating RN: Carlene Coria Primary Care Corrie Brannen: Threasa Alpha Other Clinician: Referring Katalina Magri: Threasa Alpha Treating Shadae Reino/Extender: Skipper Cliche in Treatment: 5 Vital Signs Height(in): 67 Pulse(bpm): 2 Weight(lbs): 210 Blood Pressure(mmHg): 121/82 Body Mass Index(BMI): 33 Temperature(F): 98.5 Respiratory Rate(breaths/min): 16 Photos: [N/A:N/A] Wound Location: Right, Proximal Upper Leg N/A N/A Wounding Event: Surgical Injury N/A N/A Primary Etiology: Dehisced Wound N/A N/A Comorbid History: History of Burn, Osteoarthritis N/A N/A Date Acquired: 08/07/2020 N/A N/A Weeks of Treatment: 5 N/A N/A Wound Status: Open N/A N/A Measurements L x W x D (cm) 0.1x0.1x0.1 N/A N/A Area (cm) : 0.008 N/A N/A Volume (cm) : 0.001 N/A N/A % Reduction in Area: 99.90% N/A  N/A % Reduction in Volume: 99.90% N/A N/A Classification: Full Thickness Without Exposed N/A N/A Support Structures Exudate Amount: Medium N/A N/A Exudate Type: Sanguinous N/A N/A Exudate Color: red N/A N/A Wound Margin: Flat and Intact N/A N/A Granulation Amount: None Present (0%) N/A N/A Necrotic Amount: None Present (0%) N/A N/A Exposed Structures: Fascia: No N/A N/A Fat Layer (Subcutaneous Tissue): No Tendon: No Muscle: No Joint: No Bone: No Epithelialization: Large (67-100%)  N/A N/A Treatment Notes Electronic Signature(s) Signed: 09/22/2020 8:01:20 AM By: Carlene Coria RN Entered By: Carlene Coria on 09/18/2020 10:24:46 Dahle, Vivianne Spence ([id number]) -------------------------------------------------------------------------------- [place] Details Patient Name: [name] Date of Service: 09/18/2020 10:15 AM Medical Record Number: [id number] Patient Account Number: [id number] Date of Birth/Sex: 10/29/1980 (40 y.o. F) Treating RN: Carlene Coria Primary Care Kathlean Cinco: Threasa Alpha Other Clinician: Referring Jennise Both: Threasa Alpha Treating Ryker Sudbury/Extender: Skipper Cliche in Treatment: 5 Active Inactive Electronic Signature(s) Signed: 09/22/2020 8:01:20 AM By: Carlene Coria RN Entered By: Carlene Coria on 09/18/2020 10:24:36 Lopiccolo, Vivianne Spence ([id number]) -------------------------------------------------------------------------------- Pain Assessment Details Patient Name: [name] Date of Service: 09/18/2020 10:15 AM Medical Record Number: [id number] Patient Account Number: [id number] Date of Birth/Sex: 03-16-1981 (40 y.o. F) Treating RN: Donnamarie Poag Primary Care Augusten Lipkin: Threasa Alpha Other Clinician: Referring Rayleigh Gillyard: Threasa Alpha Treating Dinisha Cai/Extender: Skipper Cliche in Treatment: 5 Active Problems Location of Pain Severity and Description of Pain Patient Has Paino No Site Locations Rate the pain. Current Pain Level:  0 Pain Management and Medication Current Pain Management: Electronic Signature(s) Signed: 09/18/2020 12:04:49 PM By: Donnamarie Poag Entered By: Donnamarie Poag on 09/18/2020 10:19:14 Dorfman, Vivianne Spence ([id number]) -------------------------------------------------------------------------------- Patient/Caregiver Education Details Patient Name: [name] Date of Service: 09/18/2020 10:15 AM Medical Record Number: [id number] Patient Account Number: [id number] Date of Birth/Gender: 05-25-1980 (40 y.o. F) Treating RN: Carlene Coria Primary Care Physician: Threasa Alpha Other Clinician: Referring Physician: Threasa Alpha Treating Physician/Extender: Skipper Cliche in Treatment: 5 Education Assessment Education Provided To: Patient Education Topics Provided Wound/Skin Impairment: Methods: Explain/Verbal Responses: State content correctly Electronic Signature(s) Signed: 09/22/2020 8:01:20 AM By: Carlene Coria RN Entered By: Carlene Coria on 09/18/2020 10:27:37 Goza, Vivianne Spence ([id number]) -------------------------------------------------------------------------------- Wound Assessment Details Patient Name: [name] Date of Service: 09/18/2020 10:15 AM Medical Record Number: [id number] Patient Account Number: [id number] Date of Birth/Sex: [birth year] (40 y.o. F) Treating RN: Carlene Coria Primary Care Johnson Arizola: Threasa Alpha Other Clinician: Referring Darryon Bastin: Threasa Alpha Treating Jolena Kittle/Extender: Skipper Cliche in Treatment: 5 Wound Status Wound Number: 1 Primary Etiology: Dehisced Wound Wound Location: Right, Proximal Upper Leg Wound Status: Healed - Epithelialized Wounding Event: Surgical Injury Comorbid History: History of Burn, Osteoarthritis Date Acquired: 08/07/2020 Weeks Of Treatment: 5 Clustered Wound: No Photos Wound Measurements Length: (cm) 0 Width: (cm) 0 Depth: (cm) 0 Area: (cm) 0 Volume: (cm) 0 % Reduction in Area: 100% % Reduction in Volume:  100% Epithelialization: Large (67-100%) Tunneling: No Undermining: No Wound Description Classification: Full Thickness Without Exposed Support Structures Wound Margin: Flat and Intact Exudate Amount: None Present Foul Odor After Cleansing: No Slough/Fibrino No Wound Bed Granulation Amount: None Present (0%) Exposed Structure Necrotic Amount: None Present (0%) Fascia Exposed: No Fat Layer (Subcutaneous Tissue) Exposed: No Tendon Exposed: No Muscle Exposed: No Joint Exposed: No Bone Exposed: No Treatment Notes Wound #1 (Upper Leg) Wound Laterality: Right, Proximal Cleanser Peri-Wound Care Topical Primary Dressing ALIZAH, SILLS ([id number]) Secondary Dressing Secured With Compression Wrap Compression Stockings Add-Ons Electronic Signature(s) Signed: 09/22/2020 8:01:20 AM By: Carlene Coria RN Entered By: Carlene Coria on 09/18/2020 10:28:24 [name] ([id number]) -------------------------------------------------------------------------------- Vitals Details Patient Name: [name] Date of Service: 09/18/2020 10:15 AM Medical Record Number: [id number] Patient Account Number: [id number] Date of Birth/Sex: Feb 02, 1981 (40 y.o. F) Treating RN: Donnamarie Poag Primary Care Kersti Scavone: Threasa Alpha Other Clinician: Referring Sabre Leonetti: Threasa Alpha Treating Shacoria Latif/Extender: Skipper Cliche in Treatment: 5 Vital Signs Time Taken: 10:15 Temperature (F): 98.5 Height (in): 67 Pulse (  bpm): 76 Weight (lbs): 210 Respiratory Rate (breaths/min): 16 Body Mass Index (BMI): 32.9 Blood Pressure (mmHg): 121/82 Reference Range: 80 - 120 mg / dl Electronic Signature(s) Signed: 09/18/2020 12:04:49 PM By: Donnamarie Poag Entered ByDonnamarie Poag on 09/18/2020 10:18:20
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
## 2020-09-24 ENCOUNTER — Ambulatory Visit: Payer: Medicare Other | Admitting: Dermatology

## 2020-09-25 ENCOUNTER — Encounter: Payer: Medicare Other | Admitting: Physician Assistant

## 2021-02-11 ENCOUNTER — Encounter: Payer: Self-pay | Admitting: Obstetrics and Gynecology

## 2021-02-11 ENCOUNTER — Ambulatory Visit (INDEPENDENT_AMBULATORY_CARE_PROVIDER_SITE_OTHER): Payer: Medicare Other | Admitting: Obstetrics and Gynecology

## 2021-02-11 ENCOUNTER — Other Ambulatory Visit: Payer: Self-pay

## 2021-02-11 VITALS — BP 120/70 | Ht 67.0 in | Wt 219.4 lb

## 2021-02-11 DIAGNOSIS — Z975 Presence of (intrauterine) contraceptive device: Secondary | ICD-10-CM

## 2021-02-11 DIAGNOSIS — Z0000 Encounter for general adult medical examination without abnormal findings: Secondary | ICD-10-CM

## 2021-02-11 DIAGNOSIS — Z01419 Encounter for gynecological examination (general) (routine) without abnormal findings: Secondary | ICD-10-CM | POA: Diagnosis not present

## 2021-02-11 DIAGNOSIS — Z3041 Encounter for surveillance of contraceptive pills: Secondary | ICD-10-CM

## 2021-02-11 DIAGNOSIS — Z1231 Encounter for screening mammogram for malignant neoplasm of breast: Secondary | ICD-10-CM | POA: Diagnosis not present

## 2021-02-11 DIAGNOSIS — Z30432 Encounter for removal of intrauterine contraceptive device: Secondary | ICD-10-CM | POA: Diagnosis not present

## 2021-02-11 DIAGNOSIS — Z1239 Encounter for other screening for malignant neoplasm of breast: Secondary | ICD-10-CM

## 2021-02-11 DIAGNOSIS — N921 Excessive and frequent menstruation with irregular cycle: Secondary | ICD-10-CM | POA: Diagnosis not present

## 2021-02-11 DIAGNOSIS — Z30431 Encounter for routine checking of intrauterine contraceptive device: Secondary | ICD-10-CM

## 2021-02-11 MED ORDER — NORETHIN ACE-ETH ESTRAD-FE 1.5-30 MG-MCG PO TABS: 1.0000 | ORAL_TABLET | Freq: Every day | ORAL | 11 refills | Status: DC
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
## 2021-02-11 NOTE — Patient Instructions (Signed)
Institute of Medicine Recommended Dietary Allowances for Calcium and Vitamin D  Age (yr) Calcium Recommended Dietary Allowance (mg/day) Vitamin D Recommended Dietary Allowance (international units/day)  9-18 1,300 600  19-50 1,000 600  51-70 1,200 600  71 and older 1,200 800  Data from Institute of Medicine. Dietary reference intakes: calcium, vitamin D. [place], [place]: National Academies Press; 2011.   Exercising to Stay Healthy To become healthy and stay healthy, it is recommended that you do moderate-intensity and vigorous-intensity exercise. You can tell that you are exercising at a moderate intensity if your heart starts beating faster and you start breathing faster but can still hold a conversation. You can tell that you are exercising at a vigorous intensity if you are breathing much harder and faster and cannot hold a conversation while exercising. How can exercise benefit me? Exercising regularly is important. It has many health benefits, such as: Improving overall fitness, flexibility, and endurance. Increasing bone density. Helping with weight control. Decreasing body fat. Increasing muscle strength and endurance. Reducing stress and tension, anxiety, depression, or anger. Improving overall health. What guidelines should I follow while exercising? Before you start a new exercise program, talk with your health care provider. Do not exercise so much that you hurt yourself, feel dizzy, or get very short of breath. Wear comfortable clothes and wear shoes with good support. Drink plenty of water while you exercise to prevent dehydration or heat stroke. Work out until your breathing and your heartbeat get faster (moderate intensity). How often should I exercise? Choose an activity that you enjoy, and set realistic goals. Your health care provider can help you make an activity plan that is individually designed and works best for you. Exercise regularly as told by your health  care provider. This may include: Doing strength training two times a week, such as: Lifting weights. Using resistance bands. Push-ups. Sit-ups. Yoga. Doing a certain intensity of exercise for a given amount of time. Choose from these options: A total of 150 minutes of moderate-intensity exercise every week. A total of 75 minutes of vigorous-intensity exercise every week. A mix of moderate-intensity and vigorous-intensity exercise every week. Children, pregnant women, people who have not exercised regularly, people who are overweight, and older adults may need to talk with a health care provider about what activities are safe to perform. If you have a medical condition, be sure to talk with your health care provider before you start a new exercise program. What are some exercise ideas? Moderate-intensity exercise ideas include: Walking 1 mile (1.6 km) in about 15 minutes. Biking. Hiking. Golfing. Dancing. Water aerobics. Vigorous-intensity exercise ideas include: Walking 4.5 miles (7.2 km) or more in about 1 hour. Jogging or running 5 miles (8 km) in about 1 hour. Biking 10 miles (16.1 km) or more in about 1 hour. Lap swimming. Roller-skating or in-line skating. Cross-country skiing. Vigorous competitive sports, such as football, basketball, and soccer. Jumping rope. Aerobic dancing. What are some everyday activities that can help me get exercise? Yard work, such as: Pushing a lawn mower. Raking and bagging leaves. Washing your car. Pushing a stroller. Shoveling snow. Gardening. Washing windows or floors. How can I be more active in my day-to-day activities? Use stairs instead of an elevator. Take a walk during your lunch break. If you drive, park your car farther away from your work or school. If you take public transportation, get off one stop early and walk the rest of the way. Stand up or walk around during all of   your indoor phone calls. Get up, stretch, and walk  around every 30 minutes throughout the day. Enjoy exercise with a friend. Support to continue exercising will help you keep a regular routine of activity. Where to find more information You can find more information about exercising to stay healthy from: U.S. Department of Health and Human Services: www.hhs.gov Centers for Disease Control and Prevention (CDC): www.cdc.gov Summary Exercising regularly is important. It will improve your overall fitness, flexibility, and endurance. Regular exercise will also improve your overall health. It can help you control your weight, reduce stress, and improve your bone density. Do not exercise so much that you hurt yourself, feel dizzy, or get very short of breath. Before you start a new exercise program, talk with your health care provider. This information is not intended to replace advice given to you by your health care provider. Make sure you discuss any questions you have with your health care provider. Document Revised: 07/31/2020 Document Reviewed: 07/31/2020 Elsevier Patient Education  2022 Elsevier Inc. Budget-Friendly Healthy Eating There are many ways to save money at the grocery store and continue to eat healthy. You can be successful if you: Plan meals according to your budget. Make a grocery list and only purchase food according to your grocery list. Prepare food yourself at home. What are tips for following this plan? Reading food labels Compare food labels between brand name foods and the store brand. Often the nutritional value is the same, but the store brand is lower cost. Look for products that do not have added sugar, fat, or salt (sodium). These often cost the same but are healthier for you. Products may be labeled as: Sugar-free. Nonfat. Low-fat. Sodium-free. Low-sodium. Look for lean ground beef labeled as at least 92% lean and 8% fat. Shopping  Buy only the items on your grocery list and go only to the areas of the store  that have the items on your list. Use coupons only for foods and brands you normally buy. Avoid buying items you wouldn't normally buy simply because they are on sale. Check online and in newspapers for weekly deals. Buy healthy items from the bulk bins when available, such as herbs, spices, flour, pasta, nuts, and dried fruit. Buy fruits and vegetables that are in season. Prices are usually lower on in-season produce. Look at the unit price on the price tag. Use it to compare different brands and sizes to find out which item is the best deal. Choose healthy items that are often low-cost, such as carrots, potatoes, apples, bananas, and oranges. Dried or canned beans are a low-cost protein source. Buy in bulk and freeze extra food. Items you can buy in bulk include meats, fish, poultry, frozen fruits, and frozen vegetables. Avoid buying "ready-to-eat" foods, such as pre-cut fruits and vegetables and pre-made salads. If possible, shop around to discover where you can find the best prices. Consider other retailers such as dollar stores, larger wholesale stores, local fruit and vegetable stands, and farmers markets. Do not shop when you are hungry. If you shop while hungry, it may be hard to stick to your list and budget. Resist impulse buying. Use your grocery list as your official plan for the week. Buy a variety of vegetables and fruits by purchasing fresh, frozen, and canned items. Look at the top and bottom shelves for deals. Foods at eye level (eye level of an adult or child) are usually more expensive. Be efficient with your time when shopping. The more time you   spend at the store, the more money you are likely to spend. To save money when choosing more expensive foods like meats and dairy: Choose cheaper cuts of meat, such as bone-in chicken thighs and drumsticks instead of skinless and boneless chicken. When you are ready to prepare the chicken, you can remove the skin yourself to make it  healthier. Choose lean meats like chicken or turkey instead of beef. Choose canned seafood, such as tuna, salmon, or sardines. Buy eggs as a low-cost source of protein. Buy dried beans and peas, such as lentils, split peas, or kidney beans instead of meats. Dried beans and peas are a good alternative source of protein. Buy the larger tubs of yogurt instead of individual-sized containers. Choose water instead of sodas and other sweetened beverages. Avoid buying chips, cookies, and other "junk food." These items are usually expensive and not healthy. Cooking Make extra food and freeze the extras in meal-sized containers or in individual portions for fast meals and snacks. Pre-cook on days when you have extra time to prepare meals in advance. You can keep these meals in the fridge or freezer and reheat for a quick meal. When you come home from the grocery store, wash, peel, and cut fruits and vegetables so they are ready to use and eat. This will help reduce food waste. Meal planning Do not eat out or get fast food. Prepare food at home. Make a grocery list and make sure to bring it with you to the store. If you have a smart phone, you could use your phone to create your shopping list. Plan meals and snacks according to a grocery list and budget you create. Use leftovers in your meal plan for the week. Look for recipes where you can cook once and make enough food for two meals. Prepare budget-friendly types of meals like stews, casseroles, and stir-fry dishes. Try some meatless meals or try "no cook" meals like salads. Make sure that half your plate is filled with fruits or vegetables. Choose from fresh, frozen, or canned fruits and vegetables. If eating canned, remember to rinse them before eating. This will remove any excess salt added for packaging. Summary Eating healthy on a budget is possible if you plan your meals according to your budget, purchase according to your budget and grocery list,  and prepare food yourself. Tips for buying more food on a limited budget include buying generic brands, using coupons only for foods you normally buy, and buying healthy items from the bulk bins when available. Tips for buying cheaper food to replace expensive food include choosing cheaper, lean cuts of meat, and buying dried beans and peas. This information is not intended to replace advice given to you by your health care provider. Make sure you discuss any questions you have with your health care provider. Document Revised: 01/16/2020 Document Reviewed: 01/16/2020 Elsevier Patient Education  2022 Elsevier Inc. Bone Health Bones protect organs, store calcium, anchor muscles, and support the whole body. Keeping your bones strong is important, especially as you get older. You can take actions to help keep your bones strong and healthy. Why is keeping my bones healthy important? Keeping your bones healthy is important because your body constantly replaces bone cells. Cells get old, and new cells take their place. As we age, we lose bone cells because the body may not be able to make enough new cells to replace the old cells. The amount of bone cells and bone tissue you have is referred to as   bone mass. The higher your bone mass, the stronger your bones. The aging process leads to an overall loss of bone mass in the body, which can increase the likelihood of: Joint pain and stiffness. Broken bones. A condition in which the bones become weak and brittle (osteoporosis). A large decline in bone mass occurs in older adults. In women, it occurs about the time of menopause. What actions can I take to keep my bones healthy? Good health habits are important for maintaining healthy bones. This includes eating nutritious foods and exercising regularly. To have healthy bones, you need to get enough of the right minerals and vitamins. Most nutrition experts recommend getting these nutrients from the foods that  you eat. In some cases, taking supplements may also be recommended. Doing certain types of exercise is also important for bone health. What are the nutritional recommendations for healthy bones? Eating a well-balanced diet with plenty of calcium and vitamin D will help to protect your bones. Nutritional recommendations vary from person to person. Ask your health care provider what is healthy for you. Here are some general guidelines. Get enough calcium Calcium is the most important (essential) mineral for bone health. Most people can get enough calcium from their diet, but supplements may be recommended for people who are at risk for osteoporosis. Good sources of calcium include: Dairy products, such as low-fat or nonfat milk, cheese, and yogurt. Dark green leafy vegetables, such as bok choy and broccoli. Calcium-fortified foods, such as orange juice, cereal, bread, soy beverages, and tofu products. Nuts, such as almonds. Follow these recommended amounts for daily calcium intake: Children, age 705-3: 700 mg. Children, age 69-8: 1,000 mg. Children, age 59-13: 1,300 mg. Teens, age 54-18: 1,300 mg. Adults, age 69-50: 1,000 mg. Adults, age 50-70: Men: 1,000 mg. Women: 1,200 mg. Adults, age 37 or older: 1,200 mg. Pregnant and breastfeeding females: Teens: 1,300 mg. Adults: 1,000 mg. Get enough vitamin D Vitamin D is the most essential vitamin for bone health. It helps the body absorb calcium. Sunlight stimulates the skin to make vitamin D, so be sure to get enough sunlight. If you live in a cold climate or you do not get outside often, your health care provider may recommend that you take vitamin D supplements. Good sources of vitamin D in your diet include: Egg yolks. Saltwater fish. Milk and cereal fortified with vitamin D. Follow these recommended amounts for daily vitamin D intake: Children and teens, age 705-18: 600 international units. Adults, age 69 or younger: 400-800 international  units. Adults, age 79 or older: 800-1,000 international units. Get other important nutrients Other nutrients that are important for bone health include: Phosphorus. This mineral is found in meat, poultry, dairy foods, nuts, and legumes. The recommended daily intake for adult men and adult women is 700 mg. Magnesium. This mineral is found in seeds, nuts, dark green vegetables, and legumes. The recommended daily intake for adult men is 400-420 mg. For adult women, it is 310-320 mg. Vitamin K. This vitamin is found in green leafy vegetables. The recommended daily intake is 120 mg for adult men and 90 mg for adult women. What type of physical activity is best for building and maintaining healthy bones? Weight-bearing and strength-building activities are important for building and maintaining healthy bones. Weight-bearing activities cause muscles and bones to work against gravity. Strength-building activities increase the strength of the muscles that support bones. Weight-bearing and muscle-building activities include: Walking and hiking. Jogging and running. Dancing. Gym exercises. Lifting weights. Tennis  and racquetball. Climbing stairs. Aerobics. Adults should get at least 30 minutes of moderate physical activity on most days. Children should get at least 60 minutes of moderate physical activity on most days. Ask your health care provider what type of exercise is best for you. How can I find out if my bone mass is low? Bone mass can be measured with an X-ray test called a bone mineral density (BMD) test. This test is recommended for all women who are age 73 or older. It may also be recommended for: Men who are age 38 or older. People who are at risk for osteoporosis because of: Having bones that break easily. Having a long-term disease that weakens bones, such as kidney disease or rheumatoid arthritis. Having menopause earlier than normal. Taking medicine that weakens bones, such as steroids,  thyroid hormones, or hormone treatment for breast cancer or prostate cancer. Smoking. Drinking three or more alcoholic drinks a day. If you find that you have a low bone mass, you may be able to prevent osteoporosis or further bone loss by changing your diet and lifestyle. Where can I find more information? For more information, check out the following websites: [place]: [employer] of Health: www.bones.SouthExposed.es International Osteoporosis Foundation: Administrator.iofbonehealth.org Summary The aging process leads to an overall loss of bone mass in the body, which can increase the likelihood of broken bones and osteoporosis. Eating a well-balanced diet with plenty of calcium and vitamin D will help to protect your bones. Weight-bearing and strength-building activities are also important for building and maintaining strong bones. Bone mass can be measured with an X-ray test called a bone mineral density (BMD) test. This information is not intended to replace advice given to you by your health care provider. Make sure you discuss any questions you have with your health care provider. Document Revised: 05/01/2017 Document Reviewed: 05/01/2017 Elsevier Patient Education  2022 [employer].

## 2021-03-30 ENCOUNTER — Ambulatory Visit
Admission: RE | Admit: 2021-03-30 | Discharge: 2021-03-30 | Disposition: A | Discharge status: Home / Self Care | Payer: Medicare Other | Source: Ambulatory Visit | Attending: Obstetrics and Gynecology | Admitting: Obstetrics and Gynecology

## 2021-03-30 ENCOUNTER — Other Ambulatory Visit: Payer: Self-pay

## 2021-03-30 DIAGNOSIS — Z1231 Encounter for screening mammogram for malignant neoplasm of breast: Secondary | ICD-10-CM | POA: Diagnosis present

## 2021-03-30 DIAGNOSIS — Z1239 Encounter for other screening for malignant neoplasm of breast: Secondary | ICD-10-CM | POA: Insufficient documentation

## 2021-06-23 ENCOUNTER — Other Ambulatory Visit: Payer: Self-pay | Admitting: Obstetrics and Gynecology

## 2021-06-23 DIAGNOSIS — Z975 Presence of (intrauterine) contraceptive device: Secondary | ICD-10-CM

## 2021-06-23 DIAGNOSIS — N921 Excessive and frequent menstruation with irregular cycle: Secondary | ICD-10-CM

## 2021-12-13 ENCOUNTER — Encounter: Payer: Self-pay | Admitting: Advanced Practice Midwife

## 2021-12-13 ENCOUNTER — Ambulatory Visit (INDEPENDENT_AMBULATORY_CARE_PROVIDER_SITE_OTHER): Payer: Medicare Other | Admitting: Advanced Practice Midwife

## 2021-12-13 VITALS — BP 100/70 | Ht 67.0 in | Wt 214.0 lb

## 2021-12-13 DIAGNOSIS — N926 Irregular menstruation, unspecified: Secondary | ICD-10-CM | POA: Diagnosis not present

## 2021-12-13 DIAGNOSIS — Z3041 Encounter for surveillance of contraceptive pills: Secondary | ICD-10-CM | POA: Diagnosis not present

## 2021-12-13 MED ORDER — NORGESTIMATE-ETH ESTRADIOL 0.25-35 MG-MCG PO TABS: 1.0000 | ORAL_TABLET | Freq: Every day | ORAL | 3 refills | Status: DC

## 2021-12-14 LAB — CBC
Hematocrit: 39.4 % (ref 34.0–46.6)
Hemoglobin: 13.2 g/dL (ref 11.1–15.9)
MCH: 31.5 pg (ref 26.6–33.0)
MCHC: 33.5 g/dL (ref 31.5–35.7)
MCV: 94 fL (ref 79–97)
Platelets: 345 10*3/uL (ref 150–450)
RBC: 4.19 x10E6/uL (ref 3.77–5.28)
RDW: 13.1 % (ref 11.7–15.4)
WBC: 6.4 10*3/uL (ref 3.4–10.8)

## 2021-12-15 ENCOUNTER — Encounter: Payer: Self-pay | Admitting: Advanced Practice Midwife

## 2021-12-15 ENCOUNTER — Other Ambulatory Visit: Payer: Self-pay | Admitting: Advanced Practice Midwife

## 2021-12-15 DIAGNOSIS — N926 Irregular menstruation, unspecified: Secondary | ICD-10-CM

## 2021-12-15 NOTE — Progress Notes (Signed)
Gyn u/s ordered to evaluate irregular menstrual bleeding
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
## 2022-01-06 ENCOUNTER — Ambulatory Visit (INDEPENDENT_AMBULATORY_CARE_PROVIDER_SITE_OTHER): Payer: Medicare Other

## 2022-01-06 ENCOUNTER — Other Ambulatory Visit: Payer: Self-pay | Admitting: Advanced Practice Midwife

## 2022-01-06 DIAGNOSIS — N926 Irregular menstruation, unspecified: Secondary | ICD-10-CM

## 2022-02-17 ENCOUNTER — Other Ambulatory Visit (HOSPITAL_COMMUNITY)
Admission: RE | Admit: 2022-02-17 | Discharge: 2022-02-17 | Disposition: A | Discharge status: Home / Self Care | Payer: Medicare Other | Source: Ambulatory Visit | Attending: Certified Nurse Midwife | Admitting: Certified Nurse Midwife

## 2022-02-17 ENCOUNTER — Encounter: Payer: Self-pay | Admitting: Certified Nurse Midwife

## 2022-02-17 ENCOUNTER — Ambulatory Visit (INDEPENDENT_AMBULATORY_CARE_PROVIDER_SITE_OTHER): Payer: Medicare Other | Admitting: Certified Nurse Midwife

## 2022-02-17 VITALS — BP 116/76 | HR 64 | Resp 15 | Ht 67.0 in | Wt 213.6 lb

## 2022-02-17 DIAGNOSIS — Z01419 Encounter for gynecological examination (general) (routine) without abnormal findings: Secondary | ICD-10-CM | POA: Diagnosis not present

## 2022-02-17 DIAGNOSIS — Z113 Encounter for screening for infections with a predominantly sexual mode of transmission: Secondary | ICD-10-CM | POA: Diagnosis not present

## 2022-02-17 DIAGNOSIS — Z23 Encounter for immunization: Secondary | ICD-10-CM | POA: Diagnosis not present

## 2022-02-17 DIAGNOSIS — Z3041 Encounter for surveillance of contraceptive pills: Secondary | ICD-10-CM | POA: Diagnosis not present

## 2022-02-17 DIAGNOSIS — E669 Obesity, unspecified: Secondary | ICD-10-CM

## 2022-02-17 DIAGNOSIS — Z1231 Encounter for screening mammogram for malignant neoplasm of breast: Secondary | ICD-10-CM

## 2022-02-17 DIAGNOSIS — N926 Irregular menstruation, unspecified: Secondary | ICD-10-CM

## 2022-02-17 MED ORDER — NORGESTIMATE-ETH ESTRADIOL 0.25-35 MG-MCG PO TABS: 1.0000 | ORAL_TABLET | Freq: Every day | ORAL | 3 refills | Status: DC
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
## 2022-02-18 LAB — HIV ANTIBODY (ROUTINE TESTING W REFLEX): HIV Screen 4th Generation wRfx: NONREACTIVE

## 2022-02-18 LAB — CERVICOVAGINAL ANCILLARY ONLY
Bacterial Vaginitis (gardnerella): NEGATIVE
Candida Glabrata: NEGATIVE
Candida Vaginitis: POSITIVE — AB
Chlamydia: NEGATIVE
Comment: NEGATIVE
Comment: NEGATIVE
Comment: NEGATIVE
Comment: NEGATIVE
Comment: NEGATIVE
Comment: NORMAL
Neisseria Gonorrhea: NEGATIVE
Trichomonas: NEGATIVE

## 2022-02-18 LAB — HEMOGLOBIN A1C
Est. average glucose Bld gHb Est-mCnc: 108 mg/dL
Hgb A1c MFr Bld: 5.4 % (ref 4.8–5.6)

## 2022-02-18 LAB — THYROID PANEL WITH TSH
Free Thyroxine Index: 2.1 (ref 1.2–4.9)
T3 Uptake Ratio: 23 % — ABNORMAL LOW (ref 24–39)
T4, Total: 9.1 ug/dL (ref 4.5–12.0)
TSH: 0.829 u[IU]/mL (ref 0.450–4.500)

## 2022-02-18 LAB — LIPID PANEL
Chol/HDL Ratio: 4.2 ratio (ref 0.0–4.4)
Cholesterol, Total: 225 mg/dL — ABNORMAL HIGH (ref 100–199)
HDL: 53 mg/dL (ref >39)
LDL Chol Calc (NIH): 144 mg/dL — ABNORMAL HIGH (ref 0–99)
Triglycerides: 155 mg/dL — ABNORMAL HIGH (ref 0–149)
VLDL Cholesterol Cal: 28 mg/dL (ref 5–40)

## 2022-02-18 LAB — HEPATITIS C ANTIBODY: Hep C Virus Ab: NONREACTIVE

## 2022-02-18 LAB — RPR: RPR Ser Ql: NONREACTIVE

## 2022-02-18 LAB — HEPATITIS B SURFACE ANTIGEN: Hepatitis B Surface Ag: NEGATIVE

## 2022-02-20 ENCOUNTER — Encounter: Payer: Self-pay | Admitting: Certified Nurse Midwife

## 2022-02-20 ENCOUNTER — Other Ambulatory Visit: Payer: Self-pay | Admitting: Certified Nurse Midwife

## 2022-02-20 MED ORDER — FLUCONAZOLE 150 MG PO TABS: 150.0000 mg | ORAL_TABLET | Freq: Every day | ORAL | 1 refills | Status: DC

## 2022-04-01 ENCOUNTER — Ambulatory Visit
Admission: RE | Admit: 2022-04-01 | Discharge: 2022-04-01 | Disposition: A | Discharge status: Home / Self Care | Payer: Medicare Other | Source: Ambulatory Visit | Attending: Certified Nurse Midwife | Admitting: Certified Nurse Midwife

## 2022-04-01 DIAGNOSIS — Z1231 Encounter for screening mammogram for malignant neoplasm of breast: Secondary | ICD-10-CM | POA: Diagnosis present

## 2022-04-01 DIAGNOSIS — Z01419 Encounter for gynecological examination (general) (routine) without abnormal findings: Secondary | ICD-10-CM | POA: Diagnosis present

## 2023-01-18 ENCOUNTER — Other Ambulatory Visit: Payer: Self-pay | Admitting: Certified Nurse Midwife

## 2023-01-18 DIAGNOSIS — Z1231 Encounter for screening mammogram for malignant neoplasm of breast: Secondary | ICD-10-CM

## 2023-01-24 ENCOUNTER — Other Ambulatory Visit: Payer: Self-pay | Admitting: Certified Nurse Midwife

## 2023-01-24 DIAGNOSIS — Z1231 Encounter for screening mammogram for malignant neoplasm of breast: Secondary | ICD-10-CM

## 2023-02-05 ENCOUNTER — Other Ambulatory Visit: Payer: Self-pay | Admitting: Certified Nurse Midwife

## 2023-02-05 DIAGNOSIS — N926 Irregular menstruation, unspecified: Secondary | ICD-10-CM

## 2023-02-05 DIAGNOSIS — Z3041 Encounter for surveillance of contraceptive pills: Secondary | ICD-10-CM

## 2023-02-23 ENCOUNTER — Ambulatory Visit: Payer: 59 | Admitting: Certified Nurse Midwife

## 2023-02-23 ENCOUNTER — Encounter: Payer: Self-pay | Admitting: Certified Nurse Midwife

## 2023-02-23 VITALS — BP 110/76 | HR 59 | Ht 67.0 in | Wt 202.8 lb

## 2023-02-23 DIAGNOSIS — Z23 Encounter for immunization: Secondary | ICD-10-CM

## 2023-02-23 DIAGNOSIS — Z13220 Encounter for screening for lipoid disorders: Secondary | ICD-10-CM

## 2023-02-23 DIAGNOSIS — N926 Irregular menstruation, unspecified: Secondary | ICD-10-CM

## 2023-02-23 DIAGNOSIS — Z01419 Encounter for gynecological examination (general) (routine) without abnormal findings: Secondary | ICD-10-CM | POA: Diagnosis not present

## 2023-02-23 DIAGNOSIS — Z3041 Encounter for surveillance of contraceptive pills: Secondary | ICD-10-CM

## 2023-02-23 DIAGNOSIS — Z1231 Encounter for screening mammogram for malignant neoplasm of breast: Secondary | ICD-10-CM

## 2023-02-23 MED ORDER — NORGESTIMATE-ETH ESTRADIOL 0.25-35 MG-MCG PO TABS
1.0000 | ORAL_TABLET | Freq: Every day | ORAL | 0 refills | Status: DC
Start: 2023-02-23 — End: 2023-07-31
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
## 2023-02-23 NOTE — Patient Instructions (Signed)
Health Maintenance, Female Adopting a healthy lifestyle and getting preventive care are important in promoting health and wellness. Ask your health care provider about: The right schedule for you to have regular tests and exams. Things you can do on your own to prevent diseases and keep yourself healthy. What should I know about diet, weight, and exercise? Eat a healthy diet  Eat a diet that includes plenty of vegetables, fruits, low-fat dairy products, and lean protein. Do not eat a lot of foods that are high in solid fats, added sugars, or sodium. Maintain a healthy weight Body mass index (BMI) is used to identify weight problems. It estimates body fat based on height and weight. Your health care provider can help determine your BMI and help you achieve or maintain a healthy weight. Get regular exercise Get regular exercise. This is one of the most important things you can do for your health. Most adults should: Exercise for at least 150 minutes each week. The exercise should increase your heart rate and make you sweat (moderate-intensity exercise). Do strengthening exercises at least twice a week. This is in addition to the moderate-intensity exercise. Spend less time sitting. Even light physical activity can be beneficial. Watch cholesterol and blood lipids Have your blood tested for lipids and cholesterol at 41 years of age, then have this test every 5 years. Have your cholesterol levels checked more often if: Your lipid or cholesterol levels are high. You are older than 41 years of age. You are at high risk for heart disease. What should I know about cancer screening? Depending on your health history and family history, you may need to have cancer screening at various ages. This may include screening for: Breast cancer. Cervical cancer. Colorectal cancer. Skin cancer. Lung cancer. What should I know about heart disease, diabetes, and high blood pressure? Blood pressure and heart  disease High blood pressure causes heart disease and increases the risk of stroke. This is more likely to develop in people who have high blood pressure readings or are overweight. Have your blood pressure checked: Every 3-5 years if you are 7-12 years of age. Every year if you are 78 years old or older. Diabetes Have regular diabetes screenings. This checks your fasting blood sugar level. Have the screening done: Once every three years after age 25 if you are at a normal weight and have a low risk for diabetes. More often and at a younger age if you are overweight or have a high risk for diabetes. What should I know about preventing infection? Hepatitis B If you have a higher risk for hepatitis B, you should be screened for this virus. Talk with your health care provider to find out if you are at risk for hepatitis B infection. Hepatitis C Testing is recommended for: Everyone born from [birth year] through 1964. Anyone with known risk factors for hepatitis C. Sexually transmitted infections (STIs) Get screened for STIs, including gonorrhea and chlamydia, if: You are sexually active and are younger than 41 years of age. You are older than 41 years of age and your health care provider tells you that you are at risk for this type of infection. Your sexual activity has changed since you were last screened, and you are at increased risk for chlamydia or gonorrhea. Ask your health care provider if you are at risk. Ask your health care provider about whether you are at high risk for HIV. Your health care provider may recommend a prescription medicine to help prevent HIV  infection. If you choose to take medicine to prevent HIV, you should first get tested for HIV. You should then be tested every 3 months for as long as you are taking the medicine. Pregnancy If you are about to stop having your period (premenopausal) and you may become pregnant, seek counseling before you get pregnant. Take 400 to 800  micrograms (mcg) of folic acid every day if you become pregnant. Ask for birth control (contraception) if you want to prevent pregnancy. Osteoporosis and menopause Osteoporosis is a disease in which the bones lose minerals and strength with aging. This can result in bone fractures. If you are 81 years old or older, or if you are at risk for osteoporosis and fractures, ask your health care provider if you should: Be screened for bone loss. Take a calcium or vitamin D supplement to lower your risk of fractures. Be given hormone replacement therapy (HRT) to treat symptoms of menopause. Follow these instructions at home: Alcohol use Do not drink alcohol if: Your health care provider tells you not to drink. You are pregnant, may be pregnant, or are planning to become pregnant. If you drink alcohol: Limit how much you have to: 0-1 drink a day. Know how much alcohol is in your drink. In the [place] one drink equals one 12 oz bottle of beer (355 mL), one 5 oz glass of wine (148 mL), or one 1 oz glass of hard liquor (44 mL). Lifestyle Do not use any products that contain nicotine or tobacco. These products include cigarettes, chewing tobacco, and vaping devices, such as e-cigarettes. If you need help quitting, ask your health care provider. Do not use street drugs. Do not share needles. Ask your health care provider for help if you need support or information about quitting drugs. General instructions Schedule regular health, dental, and eye exams. Stay current with your vaccines. Tell your health care provider if: You often feel depressed. You have ever been abused or do not feel safe at home. Summary Adopting a healthy lifestyle and getting preventive care are important in promoting health and wellness. Follow your health care provider's instructions about healthy diet, exercising, and getting tested or screened for diseases. Follow your health care provider's instructions on monitoring your  cholesterol and blood pressure. This information is not intended to replace advice given to you by your health care provider. Make sure you discuss any questions you have with your health care provider. Document Revised: 08/24/2020 Document Reviewed: 08/24/2020 Elsevier Patient Education  2024 Elsevier Inc.    Influenza (Flu) Vaccine (Inactivated or Recombinant): What You Need to Know Many vaccine information statements are available in Spanish and other languages. See PromoAge.com.br.  1. Why get vaccinated? Influenza vaccine can prevent influenza (flu). Flu is a contagious disease that spreads around the [place] every year, usually between October and May. Anyone can get the flu, but it is more dangerous for some people. Infants and young children, people 11 years and older, pregnant people, and people with certain health conditions or a weakened immune system are at greatest risk of flu complications. Pneumonia, bronchitis, sinus infections, and ear infections are examples of flu-related complications. If you have a medical condition, such as heart disease, cancer, or diabetes, flu can make it worse. Flu can cause fever and chills, sore throat, muscle aches, fatigue, cough, headache, and runny or stuffy nose. Some people may have vomiting and diarrhea, though this is more common in children than adults. In an average year, thousands of people  in the [place] die from flu, and many more are hospitalized. Flu vaccine prevents millions of illnesses and flu-related visits to the doctor each year. 2. Influenza vaccines CDC recommends everyone 6 months and older get vaccinated every flu season. Children 6 months through 31 years of age may need 2 doses during a single flu season. Everyone else needs only 1 dose each flu season. It takes about 2 weeks for protection to develop after vaccination. There are many flu viruses, and they are always changing. Each year a new flu vaccine is  made to protect against the influenza viruses believed to be likely to cause disease in the upcoming flu season. Even when the vaccine doesn't exactly match these viruses, it may still provide some protection. Influenza vaccine does not cause flu. Influenza vaccine may be given at the same time as other vaccines. 3. Talk with your health care provider Tell your vaccination provider if the person getting the vaccine: Has had an allergic reaction after a previous dose of influenza vaccine, or has any severe, life-threatening allergies Has ever had Guillain-Barr Syndrome (also called "GBS") In some cases, your health care provider may decide to postpone influenza vaccination until a future visit. Influenza vaccine can be administered at any time during pregnancy. People who are or will be pregnant during influenza season should receive inactivated influenza vaccine. People with minor illnesses, such as a cold, may be vaccinated. People who are moderately or severely ill should usually wait until they recover before getting influenza vaccine. Your health care provider can give you more information. 4. Risks of a vaccine reaction Soreness, redness, and swelling where the shot is given, fever, muscle aches, and headache can happen after influenza vaccination. There may be a very small increased risk of Guillain-Barr Syndrome (GBS) after inactivated influenza vaccine (the flu shot). Young children who get the flu shot along with pneumococcal vaccine (PCV13) and/or DTaP vaccine at the same time might be slightly more likely to have a seizure caused by fever. Tell your health care provider if a child who is getting flu vaccine has ever had a seizure. People sometimes faint after medical procedures, including vaccination. Tell your provider if you feel dizzy or have vision changes or ringing in the ears. As with any medicine, there is a very remote chance of a vaccine causing a severe allergic reaction,  other serious injury, or death. 5. What if there is a serious problem? An allergic reaction could occur after the vaccinated person leaves the clinic. If you see signs of a severe allergic reaction (hives, swelling of the face and throat, difficulty breathing, a fast heartbeat, dizziness, or weakness), call 9-1-1 and get the person to the nearest hospital. For other signs that concern you, call your health care provider. Adverse reactions should be reported to the Vaccine Adverse Event Reporting System (VAERS). Your health care provider will usually file this report, or you can do it yourself. Visit the VAERS website at www.vaers.LAgents.no or call 765-199-3055. VAERS is only for reporting reactions, and VAERS staff members do not give medical advice. 6. The National Vaccine Injury Compensation Program The [employer] Vaccine Injury Compensation Program (VICP) is a federal program that was created to compensate people who may have been injured by certain vaccines. Claims regarding alleged injury or death due to vaccination have a time limit for filing, which may be as short as two years. Visit the VICP website at SpiritualWord.at or call 702-369-5808 to learn about the program and about filing  a claim. 7. How can I learn more? Ask your health care provider. Call your local or state health department. Visit the website of the Food and Drug Administration (FDA) for vaccine package inserts and additional information at FinderList.no. Contact the Centers for Disease Control and Prevention (CDC): Call (515)007-8179 (1-800-CDC-INFO) or Visit CDC's website at BiotechRoom.com.cy. Source: CDC Vaccine Information Statement Inactivated Influenza Vaccine (11/22/2019) This same material is available at FootballExhibition.com.br for no charge. This information is not intended to replace advice given to you by your health care provider. Make sure you discuss any questions you have with  your health care provider. Document Revised: 07/20/2022 Document Reviewed: 04/25/2022 Elsevier Patient Education  2024 [employer].

## 2023-02-24 LAB — LIPID PANEL
Chol/HDL Ratio: 3.5 ratio (ref 0.0–4.4)
Cholesterol, Total: 207 mg/dL — ABNORMAL HIGH (ref 100–199)
HDL: 60 mg/dL (ref >39)
LDL Chol Calc (NIH): 125 mg/dL — ABNORMAL HIGH (ref 0–99)
Triglycerides: 124 mg/dL (ref 0–149)
VLDL Cholesterol Cal: 22 mg/dL (ref 5–40)

## 2023-02-28 ENCOUNTER — Encounter: Payer: Self-pay | Admitting: Certified Nurse Midwife

## 2023-04-03 ENCOUNTER — Ambulatory Visit
Admission: RE | Admit: 2023-04-03 | Discharge: 2023-04-03 | Disposition: A | Discharge status: Home / Self Care | Payer: 59 | Source: Ambulatory Visit | Attending: Certified Nurse Midwife | Admitting: Certified Nurse Midwife

## 2023-04-03 DIAGNOSIS — Z1231 Encounter for screening mammogram for malignant neoplasm of breast: Secondary | ICD-10-CM | POA: Insufficient documentation

## 2023-07-19 ENCOUNTER — Other Ambulatory Visit: Payer: Self-pay

## 2023-07-19 ENCOUNTER — Emergency Department
Admission: EM | Admit: 2023-07-19 | Discharge: 2023-07-19 | Disposition: A | Discharge status: Home / Self Care | Attending: Emergency Medicine | Admitting: Emergency Medicine

## 2023-07-19 ENCOUNTER — Emergency Department

## 2023-07-19 DIAGNOSIS — S43102A Unspecified dislocation of left acromioclavicular joint, initial encounter: Secondary | ICD-10-CM | POA: Diagnosis not present

## 2023-07-19 DIAGNOSIS — Y92410 Unspecified street and highway as the place of occurrence of the external cause: Secondary | ICD-10-CM | POA: Diagnosis not present

## 2023-07-19 DIAGNOSIS — S4992XA Unspecified injury of left shoulder and upper arm, initial encounter: Secondary | ICD-10-CM | POA: Diagnosis present

## 2023-07-19 MED ORDER — IBUPROFEN 600 MG PO TABS
600.0000 mg | ORAL_TABLET | Freq: Once | ORAL | Status: AC
  Administered 2023-07-19: 600 mg via ORAL
  Filled 2023-07-19: qty 1

## 2023-07-19 NOTE — Discharge Instructions (Signed)
 Recommend rest ice and elevation.  Take Tylenol 650 mg 4 times daily.  You may also take ibuprofen 600 mg 3 times daily with food.  Follow-up with orthopedics for further evaluation.  Return here for new or worse symptoms including numbness and weakness.
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
## 2023-07-30 ENCOUNTER — Other Ambulatory Visit: Payer: Self-pay | Admitting: Certified Nurse Midwife

## 2023-07-30 DIAGNOSIS — Z3041 Encounter for surveillance of contraceptive pills: Secondary | ICD-10-CM

## 2023-07-30 DIAGNOSIS — N926 Irregular menstruation, unspecified: Secondary | ICD-10-CM

## 2023-09-18 ENCOUNTER — Encounter: Payer: Self-pay | Admitting: Certified Nurse Midwife

## 2023-09-19 NOTE — Telephone Encounter (Signed)
 Pt has been scheduled for 09/20/2023 at 1:15 for a nurse visit.

## 2023-09-20 ENCOUNTER — Ambulatory Visit (INDEPENDENT_AMBULATORY_CARE_PROVIDER_SITE_OTHER)

## 2023-09-20 ENCOUNTER — Other Ambulatory Visit (HOSPITAL_COMMUNITY)
Admission: RE | Admit: 2023-09-20 | Discharge: 2023-09-20 | Disposition: A | Discharge status: Home / Self Care | Source: Ambulatory Visit | Attending: Certified Nurse Midwife | Admitting: Certified Nurse Midwife

## 2023-09-20 VITALS — BP 105/71 | HR 64 | Ht 67.0 in | Wt 187.7 lb

## 2023-09-20 DIAGNOSIS — N898 Other specified noninflammatory disorders of vagina: Secondary | ICD-10-CM | POA: Diagnosis present

## 2023-09-20 NOTE — Progress Notes (Signed)
    NURSE VISIT NOTE  Subjective:    Patient ID: Emmet Harm, female    DOB: 09-23-1980, 43 y.o.   MRN: [id number]  HPI  Patient is a 43 y.o. [id number] female who presents for vaginal itching for 5 day(s). Denies abnormal vaginal bleeding or significant pelvic pain or fever. denies UTI symptoms. Patient denies history of known exposure to STD.   Objective:    BP 105/71   Pulse 64   Ht 5\' 7"  (1.702 m)   Wt 187 lb 11.2 oz (85.1 kg)   LMP 08/21/2023 (Approximate)   BMI 29.40 kg/m     Assessment:   1. Vaginal itching   2. Vaginal irritation      Plan:   GC and chlamydia DNA  probe sent to lab. Treatment: await results for further treatment ROV prn if symptoms persist or worsen.   Vale Garrison, CMA

## 2023-09-22 LAB — CERVICOVAGINAL ANCILLARY ONLY
Bacterial Vaginitis (gardnerella): POSITIVE — AB
Candida Glabrata: NEGATIVE
Candida Vaginitis: POSITIVE — AB
Chlamydia: NEGATIVE
Comment: NEGATIVE
Comment: NEGATIVE
Comment: NEGATIVE
Comment: NEGATIVE
Comment: NEGATIVE
Comment: NORMAL
Neisseria Gonorrhea: NEGATIVE
Trichomonas: NEGATIVE

## 2023-09-25 ENCOUNTER — Ambulatory Visit: Payer: Self-pay

## 2023-09-25 ENCOUNTER — Encounter: Payer: Self-pay | Admitting: Certified Nurse Midwife

## 2023-09-25 ENCOUNTER — Other Ambulatory Visit: Payer: Self-pay | Admitting: Certified Nurse Midwife

## 2023-09-25 MED ORDER — FLUCONAZOLE 150 MG PO TABS: 150.0000 mg | ORAL_TABLET | Freq: Every day | ORAL | 1 refills | Status: AC

## 2023-09-25 MED ORDER — METRONIDAZOLE 500 MG PO TABS: 500.0000 mg | ORAL_TABLET | Freq: Two times a day (BID) | ORAL | 0 refills | Status: AC

## 2023-10-21 ENCOUNTER — Other Ambulatory Visit: Payer: Self-pay | Admitting: Certified Nurse Midwife

## 2023-10-21 DIAGNOSIS — Z3041 Encounter for surveillance of contraceptive pills: Secondary | ICD-10-CM

## 2023-10-21 DIAGNOSIS — N926 Irregular menstruation, unspecified: Secondary | ICD-10-CM

## 2023-10-23 ENCOUNTER — Encounter: Payer: Self-pay | Admitting: Certified Nurse Midwife

## 2023-10-24 ENCOUNTER — Other Ambulatory Visit: Payer: Self-pay

## 2023-10-24 DIAGNOSIS — Z3041 Encounter for surveillance of contraceptive pills: Secondary | ICD-10-CM

## 2023-10-24 DIAGNOSIS — N926 Irregular menstruation, unspecified: Secondary | ICD-10-CM

## 2023-10-24 MED ORDER — NORGESTIMATE-ETH ESTRADIOL 0.25-35 MG-MCG PO TABS: 1.0000 | ORAL_TABLET | Freq: Every day | ORAL | 1 refills | Status: DC

## 2023-11-06 ENCOUNTER — Other Ambulatory Visit: Payer: Self-pay | Admitting: Student

## 2023-11-06 DIAGNOSIS — S43102D Unspecified dislocation of left acromioclavicular joint, subsequent encounter: Secondary | ICD-10-CM

## 2023-11-06 DIAGNOSIS — M7582 Other shoulder lesions, left shoulder: Secondary | ICD-10-CM

## 2023-11-15 ENCOUNTER — Ambulatory Visit
Admission: RE | Admit: 2023-11-15 | Discharge: 2023-11-15 | Disposition: A | Discharge status: Home / Self Care | Source: Ambulatory Visit | Attending: Student | Admitting: Student

## 2023-11-15 DIAGNOSIS — S43102D Unspecified dislocation of left acromioclavicular joint, subsequent encounter: Secondary | ICD-10-CM

## 2023-11-15 DIAGNOSIS — M7582 Other shoulder lesions, left shoulder: Secondary | ICD-10-CM | POA: Insufficient documentation

## 2023-11-15 DIAGNOSIS — M75112 Incomplete rotator cuff tear or rupture of left shoulder, not specified as traumatic: Secondary | ICD-10-CM | POA: Insufficient documentation

## 2023-11-15 DIAGNOSIS — M19012 Primary osteoarthritis, left shoulder: Secondary | ICD-10-CM | POA: Diagnosis present

## 2023-11-15 MED ORDER — LIDOCAINE HCL (PF) 1 % IJ SOLN
6.0000 mL | Freq: Once | INTRAMUSCULAR | Status: AC
  Administered 2023-11-15: 6 mL
  Filled 2023-11-15: qty 6

## 2023-11-15 MED ORDER — IOHEXOL 180 MG/ML  SOLN
15.0000 mL | Freq: Once | INTRAMUSCULAR | Status: AC
  Administered 2023-11-15: 15 mL

## 2023-11-15 MED ORDER — GADOBUTROL 1 MMOL/ML IV SOLN
0.5000 mL | Freq: Once | INTRAVENOUS | Status: AC | PRN
  Administered 2023-11-15: 0.5 mL

## 2024-04-06 ENCOUNTER — Other Ambulatory Visit: Payer: Self-pay | Admitting: Certified Nurse Midwife

## 2024-04-06 DIAGNOSIS — Z3041 Encounter for surveillance of contraceptive pills: Secondary | ICD-10-CM

## 2024-04-06 DIAGNOSIS — N926 Irregular menstruation, unspecified: Secondary | ICD-10-CM

## 2024-04-09 MED ORDER — NORGESTIMATE-ETH ESTRADIOL 0.25-35 MG-MCG PO TABS: 1.0000 | ORAL_TABLET | Freq: Every day | ORAL | 0 refills | Status: DC

## 2024-04-09 NOTE — Telephone Encounter (Signed)
 TRIAGE VOICEMAIL: Patient states she has scheduled her annual 05/10/24. Requesting refill of norgestimate -ethinyl estradiol  (ORTHO-CYCLEN) 0.25-35 MG-MCG tablet

## 2024-04-09 NOTE — Telephone Encounter (Signed)
 1 pack sent to pharmacy. Left voicemail to notify patient.

## 2024-04-09 NOTE — Addendum Note (Signed)
 Addended by: TAFT CAMELIA MATSU on: 04/09/2024 11:28 AM   Modules accepted: Orders

## 2024-05-09 ENCOUNTER — Other Ambulatory Visit: Payer: Self-pay | Admitting: Certified Nurse Midwife

## 2024-05-09 DIAGNOSIS — N926 Irregular menstruation, unspecified: Secondary | ICD-10-CM

## 2024-05-09 DIAGNOSIS — Z3041 Encounter for surveillance of contraceptive pills: Secondary | ICD-10-CM

## 2024-05-10 ENCOUNTER — Ambulatory Visit: Admitting: Certified Nurse Midwife

## 2024-06-11 ENCOUNTER — Ambulatory Visit: Admitting: Certified Nurse Midwife
